# Patient Record
Sex: Female | Born: 1975
Health system: Southern US, Community
[De-identification: ages and names within clinical notes are randomized; demographics above are authoritative.]

## PROBLEM LIST (undated history)

## (undated) DIAGNOSIS — M549 Dorsalgia, unspecified: Secondary | ICD-10-CM

## (undated) DIAGNOSIS — M6740 Ganglion, unspecified site: Secondary | ICD-10-CM

## (undated) DIAGNOSIS — F32A Depression, unspecified: Secondary | ICD-10-CM

## (undated) DIAGNOSIS — I341 Nonrheumatic mitral (valve) prolapse: Secondary | ICD-10-CM

## (undated) DIAGNOSIS — F329 Major depressive disorder, single episode, unspecified: Secondary | ICD-10-CM

## (undated) DIAGNOSIS — G43909 Migraine, unspecified, not intractable, without status migrainosus: Secondary | ICD-10-CM

## (undated) DIAGNOSIS — G8929 Other chronic pain: Secondary | ICD-10-CM

## (undated) DIAGNOSIS — I499 Cardiac arrhythmia, unspecified: Secondary | ICD-10-CM

## (undated) HISTORY — DX: Migraine, unspecified, not intractable, without status migrainosus: G43.909

## (undated) HISTORY — PX: WISDOM TOOTH EXTRACTION: SHX21

## (undated) HISTORY — DX: Dorsalgia, unspecified: M54.9

## (undated) HISTORY — DX: Other chronic pain: G89.29

## (undated) HISTORY — DX: Ganglion, unspecified site: M67.40

## (undated) HISTORY — DX: Major depressive disorder, single episode, unspecified: F32.9

## (undated) HISTORY — DX: Nonrheumatic mitral (valve) prolapse: I34.1

## (undated) HISTORY — DX: Depression, unspecified: F32.A

## (undated) HISTORY — DX: Cardiac arrhythmia, unspecified: I49.9

---

## 2000-09-18 ENCOUNTER — Encounter: Admission: RE | Admit: 2000-09-18 | Discharge: 2000-10-18 | Payer: Self-pay | Admitting: Family Medicine

## 2009-12-02 ENCOUNTER — Inpatient Hospital Stay (HOSPITAL_COMMUNITY)
Admission: AD | Admit: 2009-12-02 | Discharge: 2009-12-02 | Payer: Self-pay | Source: Home / Self Care | Admitting: Obstetrics and Gynecology

## 2009-12-02 DIAGNOSIS — O209 Hemorrhage in early pregnancy, unspecified: Secondary | ICD-10-CM

## 2010-02-03 ENCOUNTER — Inpatient Hospital Stay (HOSPITAL_COMMUNITY): Admission: AD | Admit: 2010-02-03 | Payer: Self-pay | Admitting: Obstetrics and Gynecology

## 2010-04-01 ENCOUNTER — Inpatient Hospital Stay (HOSPITAL_COMMUNITY)
Admission: AD | Admit: 2010-04-01 | Discharge: 2010-04-01 | Disposition: A | Discharge status: Home / Self Care | Payer: BC Managed Care – PPO | Source: Ambulatory Visit | Attending: Obstetrics and Gynecology | Admitting: Obstetrics and Gynecology

## 2010-04-01 DIAGNOSIS — IMO0002 Reserved for concepts with insufficient information to code with codable children: Secondary | ICD-10-CM | POA: Insufficient documentation

## 2010-04-01 LAB — CBC
HCT: 35.9 % — ABNORMAL LOW (ref 36.0–46.0)
Hemoglobin: 11.8 g/dL — ABNORMAL LOW (ref 12.0–15.0)
MCH: 29.4 pg (ref 26.0–34.0)
MCHC: 32.9 g/dL (ref 30.0–36.0)
MCV: 89.3 fL (ref 78.0–100.0)
Platelets: 254 10*3/uL (ref 150–400)
RBC: 4.02 MIL/uL (ref 3.87–5.11)
RDW: 13.5 % (ref 11.5–15.5)
WBC: 14.1 10*3/uL — ABNORMAL HIGH (ref 4.0–10.5)

## 2010-04-01 LAB — COMPREHENSIVE METABOLIC PANEL
ALT: 13 U/L (ref 0–35)
AST: 17 U/L (ref 0–37)
Albumin: 2.7 g/dL — ABNORMAL LOW (ref 3.5–5.2)
Alkaline Phosphatase: 91 U/L (ref 39–117)
BUN: 3 mg/dL — ABNORMAL LOW (ref 6–23)
CO2: 26 mEq/L (ref 19–32)
Calcium: 9.5 mg/dL (ref 8.4–10.5)
Chloride: 104 mEq/L (ref 96–112)
Creatinine, Ser: 0.62 mg/dL (ref 0.4–1.2)
GFR calc Af Amer: >60 mL/min (ref >60)
GFR calc non Af Amer: >60 mL/min (ref >60)
Glucose, Bld: 101 mg/dL — ABNORMAL HIGH (ref 70–99)
Potassium: 3.6 mEq/L (ref 3.5–5.1)
Total Bilirubin: 0.2 mg/dL — ABNORMAL LOW (ref 0.3–1.2)

## 2010-04-01 LAB — URINALYSIS, ROUTINE W REFLEX MICROSCOPIC
Bilirubin Urine: NEGATIVE
Hgb urine dipstick: NEGATIVE
Ketones, ur: NEGATIVE mg/dL
Nitrite: NEGATIVE
Protein, ur: NEGATIVE mg/dL
Urine Glucose, Fasting: NEGATIVE mg/dL
Urobilinogen, UA: 0.2 mg/dL (ref 0.0–1.0)
pH: 7.5 (ref 5.0–8.0)

## 2010-04-01 LAB — LACTATE DEHYDROGENASE: LDH: 105 U/L (ref 94–250)

## 2010-04-01 LAB — URIC ACID: Uric Acid, Serum: 5.4 mg/dL (ref 2.4–7.0)

## 2010-06-08 ENCOUNTER — Inpatient Hospital Stay (HOSPITAL_COMMUNITY)
Admission: AD | Admit: 2010-06-08 | Discharge: 2010-06-10 | DRG: 372 | Disposition: A | Discharge status: Home / Self Care | Payer: BC Managed Care – PPO | Source: Ambulatory Visit | Attending: Obstetrics and Gynecology | Admitting: Obstetrics and Gynecology

## 2010-06-08 DIAGNOSIS — O329XX0 Maternal care for malpresentation of fetus, unspecified, not applicable or unspecified: Secondary | ICD-10-CM | POA: Diagnosis present

## 2010-06-08 DIAGNOSIS — O139 Gestational [pregnancy-induced] hypertension without significant proteinuria, unspecified trimester: Principal | ICD-10-CM | POA: Diagnosis present

## 2010-06-08 DIAGNOSIS — O328XX0 Maternal care for other malpresentation of fetus, not applicable or unspecified: Secondary | ICD-10-CM | POA: Diagnosis present

## 2010-06-08 LAB — COMPREHENSIVE METABOLIC PANEL
ALT: 15 U/L (ref 0–35)
AST: 20 U/L (ref 0–37)
Albumin: 2.7 g/dL — ABNORMAL LOW (ref 3.5–5.2)
Alkaline Phosphatase: 176 U/L — ABNORMAL HIGH (ref 39–117)
CO2: 20 mEq/L (ref 19–32)
Calcium: 10.5 mg/dL (ref 8.4–10.5)
Chloride: 103 mEq/L (ref 96–112)
Creatinine, Ser: 0.69 mg/dL (ref 0.4–1.2)
GFR calc Af Amer: >60 mL/min (ref >60)
Glucose, Bld: 104 mg/dL — ABNORMAL HIGH (ref 70–99)
Potassium: 4.1 mEq/L (ref 3.5–5.1)
Sodium: 138 mEq/L (ref 135–145)
Total Bilirubin: 0.2 mg/dL — ABNORMAL LOW (ref 0.3–1.2)
Total Protein: 6.3 g/dL (ref 6.0–8.3)

## 2010-06-08 LAB — LACTATE DEHYDROGENASE: LDH: 171 U/L (ref 94–250)

## 2010-06-08 LAB — CBC
HCT: 38.9 % (ref 36.0–46.0)
MCH: 29.1 pg (ref 26.0–34.0)
MCHC: 33.2 g/dL (ref 30.0–36.0)
Platelets: 236 10*3/uL (ref 150–400)
RBC: 4.43 MIL/uL (ref 3.87–5.11)
RDW: 14.3 % (ref 11.5–15.5)
WBC: 11.3 10*3/uL — ABNORMAL HIGH (ref 4.0–10.5)

## 2010-06-08 LAB — URIC ACID: Uric Acid, Serum: 7.2 mg/dL — ABNORMAL HIGH (ref 2.4–7.0)

## 2010-06-09 LAB — CBC
HCT: 34.8 % — ABNORMAL LOW (ref 36.0–46.0)
Hemoglobin: 11.2 g/dL — ABNORMAL LOW (ref 12.0–15.0)
MCH: 28.4 pg (ref 26.0–34.0)
MCHC: 32.2 g/dL (ref 30.0–36.0)
MCV: 88.3 fL (ref 78.0–100.0)
Platelets: 206 10*3/uL (ref 150–400)
RBC: 3.94 MIL/uL (ref 3.87–5.11)
WBC: 17.7 10*3/uL — ABNORMAL HIGH (ref 4.0–10.5)

## 2013-04-09 ENCOUNTER — Encounter: Payer: Self-pay | Admitting: *Deleted

## 2013-04-09 DIAGNOSIS — E669 Obesity, unspecified: Secondary | ICD-10-CM | POA: Insufficient documentation

## 2013-04-09 DIAGNOSIS — M6740 Ganglion, unspecified site: Secondary | ICD-10-CM | POA: Insufficient documentation

## 2013-04-09 DIAGNOSIS — E789 Disorder of lipoprotein metabolism, unspecified: Secondary | ICD-10-CM | POA: Insufficient documentation

## 2013-04-12 ENCOUNTER — Other Ambulatory Visit: Payer: Self-pay | Admitting: *Deleted

## 2013-04-12 DIAGNOSIS — Z0000 Encounter for general adult medical examination without abnormal findings: Secondary | ICD-10-CM

## 2013-04-13 ENCOUNTER — Other Ambulatory Visit: Payer: BC Managed Care – PPO

## 2013-04-14 ENCOUNTER — Other Ambulatory Visit: Payer: BC Managed Care – PPO

## 2013-04-14 LAB — COMPLETE METABOLIC PANEL WITH GFR
ALT: 25 U/L (ref 0–35)
AST: 24 U/L (ref 0–37)
Albumin: 4.1 g/dL (ref 3.5–5.2)
Alkaline Phosphatase: 86 U/L (ref 39–117)
BUN: 7 mg/dL (ref 6–23)
CO2: 25 mEq/L (ref 19–32)
Calcium: 9.2 mg/dL (ref 8.4–10.5)
Chloride: 104 mEq/L (ref 96–112)
Creat: 0.6 mg/dL (ref 0.50–1.10)
GFR, Est African American: >89 mL/min
GFR, Est Non African American: >89 mL/min
Glucose, Bld: 102 mg/dL — ABNORMAL HIGH (ref 70–99)
Potassium: 3.8 mEq/L (ref 3.5–5.3)
Sodium: 138 mEq/L (ref 135–145)
Total Bilirubin: 0.3 mg/dL (ref 0.2–1.2)
Total Protein: 6.6 g/dL (ref 6.0–8.3)

## 2013-04-14 LAB — CBC WITH DIFFERENTIAL/PLATELET
Basophils Absolute: 0 10*3/uL (ref 0.0–0.1)
Basophils Relative: 0 % (ref 0–1)
Eosinophils Absolute: 0.5 10*3/uL (ref 0.0–0.7)
Eosinophils Relative: 6 % — ABNORMAL HIGH (ref 0–5)
HCT: 40.6 % (ref 36.0–46.0)
Hemoglobin: 13.9 g/dL (ref 12.0–15.0)
Lymphocytes Relative: 26 % (ref 12–46)
Lymphs Abs: 2 10*3/uL (ref 0.7–4.0)
MCH: 29.7 pg (ref 26.0–34.0)
MCHC: 34.2 g/dL (ref 30.0–36.0)
MCV: 86.8 fL (ref 78.0–100.0)
Monocytes Absolute: 0.5 10*3/uL (ref 0.1–1.0)
Monocytes Relative: 7 % (ref 3–12)
Neutro Abs: 4.7 10*3/uL (ref 1.7–7.7)
Neutrophils Relative %: 61 % (ref 43–77)
Platelets: 356 10*3/uL (ref 150–400)
RBC: 4.68 MIL/uL (ref 3.87–5.11)
RDW: 14.1 % (ref 11.5–15.5)
WBC: 7.7 10*3/uL (ref 4.0–10.5)

## 2013-04-14 LAB — LIPID PANEL
Cholesterol: 142 mg/dL (ref 0–200)
HDL: 38 mg/dL — ABNORMAL LOW (ref >39)
LDL Cholesterol: 79 mg/dL (ref 0–99)
Total CHOL/HDL Ratio: 3.7 Ratio
Triglycerides: 125 mg/dL (ref <150)
VLDL: 25 mg/dL (ref 0–40)

## 2013-04-15 LAB — TSH: TSH: 2.447 u[IU]/mL (ref 0.350–4.500)

## 2013-04-20 ENCOUNTER — Ambulatory Visit (INDEPENDENT_AMBULATORY_CARE_PROVIDER_SITE_OTHER): Payer: BC Managed Care – PPO | Admitting: Family Medicine

## 2013-04-20 ENCOUNTER — Encounter (INDEPENDENT_AMBULATORY_CARE_PROVIDER_SITE_OTHER): Payer: Self-pay

## 2013-04-20 ENCOUNTER — Encounter: Payer: Self-pay | Admitting: Family Medicine

## 2013-04-20 VITALS — BP 123/84 | HR 72 | Resp 16 | Ht 62.0 in | Wt 199.0 lb

## 2013-04-20 DIAGNOSIS — M549 Dorsalgia, unspecified: Secondary | ICD-10-CM

## 2013-04-20 DIAGNOSIS — E669 Obesity, unspecified: Secondary | ICD-10-CM

## 2013-04-20 DIAGNOSIS — G5600 Carpal tunnel syndrome, unspecified upper limb: Secondary | ICD-10-CM

## 2013-04-20 DIAGNOSIS — Z5181 Encounter for therapeutic drug level monitoring: Secondary | ICD-10-CM

## 2013-04-20 DIAGNOSIS — R7301 Impaired fasting glucose: Secondary | ICD-10-CM

## 2013-04-20 MED ORDER — DICLOFENAC SODIUM 75 MG PO TBEC: 75.0000 mg | DELAYED_RELEASE_TABLET | Freq: Two times a day (BID) | ORAL | Status: DC
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
## 2013-04-20 NOTE — Patient Instructions (Signed)
1)  Joint Pain - High quality fish oil Lisette Grinder([employer]) - 2000 - 4000 mg EPA/DHA.  Back - Back Magic; Wrists - Wrist split made by [employer].  Diclofenac 75 mg twice a day +/- Flexeril.  You might also consider Cymbalta.         Carpal Tunnel Syndrome The carpal tunnel is a narrow area located on the palm side of your wrist. The tunnel is formed by the wrist bones and ligaments. Nerves, Rorrer vessels, and tendons pass through the carpal tunnel. Repeated wrist motion or certain diseases may cause swelling within the tunnel. This swelling pinches the main nerve in the wrist (median nerve) and causes the painful hand and arm condition called carpal tunnel syndrome. CAUSES   Repeated wrist motions.  Wrist injuries.  Certain diseases like arthritis, diabetes, alcoholism, hyperthyroidism, and kidney failure.  Obesity.  Pregnancy. SYMPTOMS   A "pins and needles" feeling in your fingers or hand.  Tingling or numbness in your fingers or hand.  An aching feeling in your entire arm.  Wrist pain that goes up your arm to your shoulder.  Pain that goes down into your palm or fingers.  A weak feeling in your hands. DIAGNOSIS  Your caregiver will take your history and perform a physical exam. An electromyography test may be needed. This test measures electrical signals sent out by the muscles. The electrical signals are usually slowed by carpal tunnel syndrome. You may also need X-rays. TREATMENT  Carpal tunnel syndrome may clear up by itself. Your caregiver may recommend a wrist splint or medicine such as a nonsteroidal anti-inflammatory medicine. Cortisone injections may help. Sometimes, surgery may be needed to free the pinched nerve.  HOME CARE INSTRUCTIONS   Take all medicine as directed by your caregiver. Only take over-the-counter or prescription medicines for pain, discomfort, or fever as directed by your caregiver.  If you were given a splint to keep your wrist from  bending, wear it as directed. It is important to wear the splint at night. Wear the splint for as long as you have pain or numbness in your hand, arm, or wrist. This may take 1 to 2 months.  Rest your wrist from any activity that may be causing your pain. If your symptoms are work-related, you may need to talk to your employer about changing to a job that does not require using your wrist.  Put ice on your wrist after long periods of wrist activity.  Put ice in a plastic bag.  Place a towel between your skin and the bag.  Leave the ice on for 15-20 minutes, 03-04 times a day.  Keep all follow-up visits as directed by your caregiver. This includes any orthopedic referrals, physical therapy, and rehabilitation. Any delay in getting necessary care could result in a delay or failure of your condition to heal. SEEK IMMEDIATE MEDICAL CARE IF:   You have new, unexplained symptoms.  Your symptoms get worse and are not helped or controlled with medicines. MAKE SURE YOU:   Understand these instructions.  Will watch your condition.  Will get help right away if you are not doing well or get worse. Document Released: 01/19/2000 Document Revised: 04/15/2011 Document Reviewed: 12/07/2010 [facility] Patient Information 2014 [place], [place].

## 2013-05-14 ENCOUNTER — Encounter: Payer: Self-pay | Admitting: *Deleted

## 2013-06-20 DIAGNOSIS — R7301 Impaired fasting glucose: Secondary | ICD-10-CM | POA: Insufficient documentation

## 2013-06-20 DIAGNOSIS — M549 Dorsalgia, unspecified: Secondary | ICD-10-CM | POA: Insufficient documentation

## 2013-06-20 DIAGNOSIS — Z5181 Encounter for therapeutic drug level monitoring: Secondary | ICD-10-CM | POA: Insufficient documentation

## 2013-06-20 DIAGNOSIS — G5600 Carpal tunnel syndrome, unspecified upper limb: Secondary | ICD-10-CM | POA: Insufficient documentation

## 2013-06-20 DIAGNOSIS — E669 Obesity, unspecified: Secondary | ICD-10-CM | POA: Insufficient documentation

## 2013-07-22 ENCOUNTER — Ambulatory Visit (INDEPENDENT_AMBULATORY_CARE_PROVIDER_SITE_OTHER): Payer: BC Managed Care – PPO | Admitting: Family Medicine

## 2013-07-22 ENCOUNTER — Encounter: Payer: Self-pay | Admitting: Family Medicine

## 2013-07-22 VITALS — BP 136/66 | HR 90 | Resp 16 | Ht 62.5 in | Wt 206.0 lb

## 2013-07-22 DIAGNOSIS — M549 Dorsalgia, unspecified: Secondary | ICD-10-CM

## 2013-07-22 DIAGNOSIS — G8929 Other chronic pain: Secondary | ICD-10-CM

## 2013-07-22 MED ORDER — METAXALONE 800 MG PO TABS: 800.0000 mg | ORAL_TABLET | Freq: Four times a day (QID) | ORAL | Status: AC

## 2013-07-22 MED ORDER — NAPROXEN 500 MG PO TABS: 500.0000 mg | ORAL_TABLET | Freq: Two times a day (BID) | ORAL | Status: AC

## 2013-07-22 MED ORDER — TRAMADOL-ACETAMINOPHEN 37.5-325 MG PO TABS: 1.0000 | ORAL_TABLET | Freq: Four times a day (QID) | ORAL | Status: AC | PRN
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
## 2013-08-03 ENCOUNTER — Ambulatory Visit (HOSPITAL_BASED_OUTPATIENT_CLINIC_OR_DEPARTMENT_OTHER): Payer: BC Managed Care – PPO

## 2013-09-04 ENCOUNTER — Ambulatory Visit (HOSPITAL_BASED_OUTPATIENT_CLINIC_OR_DEPARTMENT_OTHER): Payer: BC Managed Care – PPO

## 2013-09-24 ENCOUNTER — Encounter: Payer: Self-pay | Admitting: Family Medicine

## 2013-10-12 ENCOUNTER — Other Ambulatory Visit (HOSPITAL_BASED_OUTPATIENT_CLINIC_OR_DEPARTMENT_OTHER): Payer: BC Managed Care – PPO

## 2014-10-19 ENCOUNTER — Emergency Department (HOSPITAL_BASED_OUTPATIENT_CLINIC_OR_DEPARTMENT_OTHER)
Admission: EM | Admit: 2014-10-19 | Discharge: 2014-10-19 | Disposition: A | Discharge status: Home / Self Care | Payer: No Typology Code available for payment source | Attending: Emergency Medicine | Admitting: Emergency Medicine

## 2014-10-19 ENCOUNTER — Encounter (HOSPITAL_BASED_OUTPATIENT_CLINIC_OR_DEPARTMENT_OTHER): Payer: Self-pay

## 2014-10-19 ENCOUNTER — Emergency Department (HOSPITAL_BASED_OUTPATIENT_CLINIC_OR_DEPARTMENT_OTHER): Payer: No Typology Code available for payment source

## 2014-10-19 DIAGNOSIS — Y998 Other external cause status: Secondary | ICD-10-CM | POA: Insufficient documentation

## 2014-10-19 DIAGNOSIS — S20219A Contusion of unspecified front wall of thorax, initial encounter: Secondary | ICD-10-CM | POA: Insufficient documentation

## 2014-10-19 DIAGNOSIS — Z8739 Personal history of other diseases of the musculoskeletal system and connective tissue: Secondary | ICD-10-CM | POA: Insufficient documentation

## 2014-10-19 DIAGNOSIS — Z8679 Personal history of other diseases of the circulatory system: Secondary | ICD-10-CM | POA: Diagnosis not present

## 2014-10-19 DIAGNOSIS — Y92410 Unspecified street and highway as the place of occurrence of the external cause: Secondary | ICD-10-CM | POA: Insufficient documentation

## 2014-10-19 DIAGNOSIS — G8929 Other chronic pain: Secondary | ICD-10-CM | POA: Insufficient documentation

## 2014-10-19 DIAGNOSIS — S29001A Unspecified injury of muscle and tendon of front wall of thorax, initial encounter: Secondary | ICD-10-CM | POA: Diagnosis present

## 2014-10-19 DIAGNOSIS — Y9389 Activity, other specified: Secondary | ICD-10-CM | POA: Insufficient documentation

## 2014-10-19 DIAGNOSIS — F329 Major depressive disorder, single episode, unspecified: Secondary | ICD-10-CM | POA: Insufficient documentation

## 2014-10-19 MED ORDER — ORPHENADRINE CITRATE ER 100 MG PO TB12: 100.0000 mg | ORAL_TABLET | Freq: Two times a day (BID) | ORAL | Status: DC

## 2014-10-19 MED ORDER — TRAMADOL HCL 50 MG PO TABS: 50.0000 mg | ORAL_TABLET | Freq: Four times a day (QID) | ORAL | Status: DC | PRN

## 2014-10-19 MED ORDER — IBUPROFEN 800 MG PO TABS
800.0000 mg | ORAL_TABLET | Freq: Once | ORAL | Status: AC
  Administered 2014-10-19: 800 mg via ORAL
  Filled 2014-10-19: qty 1

## 2014-10-19 MED ORDER — IBUPROFEN 600 MG PO TABS: 600.0000 mg | ORAL_TABLET | Freq: Four times a day (QID) | ORAL | Status: DC | PRN

## 2014-10-19 NOTE — ED Notes (Signed)
Patient transported to X-ray 
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
## 2014-10-19 NOTE — Discharge Instructions (Signed)
Chest Contusion °A chest contusion is a deep bruise on your chest area. Contusions are the result of an injury that caused bleeding under the skin. A chest contusion may involve bruising of the skin, muscles, or ribs. The contusion may turn blue, purple, or yellow. Minor injuries will give you a painless contusion, but more severe contusions may stay painful and swollen for a few weeks. °CAUSES  °A contusion is usually caused by a blow, trauma, or direct force to an area of the body. °SYMPTOMS  °· Swelling and redness of the injured area. °· Discoloration of the injured area. °· Tenderness and soreness of the injured area. °· Pain. °DIAGNOSIS  °The diagnosis can be made by taking a history and performing a physical exam. An X-ray, CT scan, or MRI may be needed to determine if there were any associated injuries, such as broken bones (fractures) or internal injuries. °TREATMENT  °Often, the best treatment for a chest contusion is resting, icing, and applying cold compresses to the injured area. Deep breathing exercises may be recommended to reduce the risk of pneumonia. Over-the-counter medicines may also be recommended for pain control. °HOME CARE INSTRUCTIONS  °· Put ice on the injured area. °· Put ice in a plastic bag. °· Place a towel between your skin and the bag. °· Leave the ice on for 15-20 minutes, 03-04 times a day. °· Only take over-the-counter or prescription medicines as directed by your caregiver. Your caregiver may recommend avoiding anti-inflammatory medicines (aspirin, ibuprofen, and naproxen) for 48 hours because these medicines may increase bruising. °· Rest the injured area. °· Perform deep-breathing exercises as directed by your caregiver. °· Stop smoking if you smoke. °· Do not lift objects over 5 pounds (2.3 kg) for 3 days or longer if recommended by your caregiver. °SEEK IMMEDIATE MEDICAL CARE IF:  °· You have increased bruising or swelling. °· You have pain that is getting worse. °· You have  difficulty breathing. °· You have dizziness, weakness, or fainting. °· You have [place] in your urine or stool. °· You cough up or vomit [place]amp[place]. °· Your swelling or pain is not relieved with medicines. °MAKE SURE YOU:  °· Understand these instructions. °· Will watch your condition. °· Will get help right away if you are not doing well or get worse. °Document Released: 10/16/2000 Document Revised: 10/16/2011 Document Reviewed: 07/15/2011 °ExitCare® Patient Information ©2015 ExitCare, LLC. This information is not intended to replace advice given to you by your health care provider. Make sure you discuss any questions you have with your health care provider. ° °Motor Vehicle Collision °It is common to have multiple bruises and sore muscles after a motor vehicle collision (MVC). These tend to feel worse for the first 24 hours. You may have the most stiffness and soreness over the first several hours. You may also feel worse when you wake up the first morning after your collision. After this point, you will usually begin to improve with each day. The speed of improvement often depends on the severity of the collision, the number of injuries, and the location and nature of these injuries. °HOME CARE INSTRUCTIONS °· Put ice on the injured area. °¨ Put ice in a plastic bag. °¨ Place a towel between your skin and the bag. °¨ Leave the ice on for 15-20 minutes, 3-4 times a day, or as directed by your health care provider. °· Drink enough fluids to keep your urine clear or pale yellow. Do not drink alcohol. °· Take a   warm shower or bath once or twice a day. This will increase [place] flow to sore muscles. °· You may return to activities as directed by your caregiver. Be careful when lifting, as this may aggravate neck or back pain. °· Only take over-the-counter or prescription medicines for pain, discomfort, or fever as directed by your caregiver. Do not use aspirin. This may increase bruising and bleeding. °SEEK IMMEDIATE MEDICAL  CARE IF: °· You have numbness, tingling, or weakness in the arms or legs. °· You develop severe headaches not relieved with medicine. °· You have severe neck pain, especially tenderness in the middle of the back of your neck. °· You have changes in bowel or bladder control. °· There is increasing pain in any area of the body. °· You have shortness of breath, light-headedness, dizziness, or fainting. °· You have chest pain. °· You feel sick to your stomach (nauseous), throw up (vomit), or sweat. °· You have increasing abdominal discomfort. °· There is [place] in your urine, stool, or vomit. °· You have pain in your shoulder (shoulder strap areas). °· You feel your symptoms are getting worse. °MAKE SURE YOU: °· Understand these instructions. °· Will watch your condition. °· Will get help right away if you are not doing well or get worse. °Document Released: 01/21/2005 Document Revised: 06/07/2013 Document Reviewed: 06/20/2010 °ExitCare® Patient Information ©2015 ExitCare, LLC. This information is not intended to replace advice given to you by your health care provider. Make sure you discuss any questions you have with your health care provider. ° °

## 2015-10-11 DIAGNOSIS — Z01419 Encounter for gynecological examination (general) (routine) without abnormal findings: Secondary | ICD-10-CM | POA: Diagnosis not present

## 2015-10-11 DIAGNOSIS — N898 Other specified noninflammatory disorders of vagina: Secondary | ICD-10-CM | POA: Diagnosis not present

## 2015-10-11 DIAGNOSIS — Z6838 Body mass index (BMI) 38.0-38.9, adult: Secondary | ICD-10-CM | POA: Diagnosis not present

## 2015-10-11 DIAGNOSIS — Z1151 Encounter for screening for human papillomavirus (HPV): Secondary | ICD-10-CM | POA: Diagnosis not present

## 2016-04-02 DIAGNOSIS — Z23 Encounter for immunization: Secondary | ICD-10-CM | POA: Diagnosis not present

## 2016-12-05 DIAGNOSIS — J020 Streptococcal pharyngitis: Secondary | ICD-10-CM | POA: Diagnosis not present

## 2016-12-05 DIAGNOSIS — J029 Acute pharyngitis, unspecified: Secondary | ICD-10-CM | POA: Diagnosis not present

## 2016-12-05 DIAGNOSIS — R05 Cough: Secondary | ICD-10-CM | POA: Diagnosis not present

## 2017-03-13 DIAGNOSIS — Z1231 Encounter for screening mammogram for malignant neoplasm of breast: Secondary | ICD-10-CM | POA: Diagnosis not present

## 2017-08-26 ENCOUNTER — Ambulatory Visit: Payer: BLUE CROSS/BLUE SHIELD | Admitting: Women's Health

## 2017-08-26 ENCOUNTER — Encounter: Payer: Self-pay | Admitting: Women's Health

## 2017-08-26 VITALS — BP 122/80 | Ht 63.0 in | Wt 220.0 lb

## 2017-08-26 DIAGNOSIS — Z13220 Encounter for screening for lipoid disorders: Secondary | ICD-10-CM

## 2017-08-26 DIAGNOSIS — Z01419 Encounter for gynecological examination (general) (routine) without abnormal findings: Secondary | ICD-10-CM | POA: Diagnosis not present

## 2017-08-26 DIAGNOSIS — N898 Other specified noninflammatory disorders of vagina: Secondary | ICD-10-CM

## 2017-08-26 LAB — WET PREP FOR TRICH, YEAST, CLUE

## 2017-08-26 MED ORDER — TERCONAZOLE 0.4 % VA CREA: 1.0000 | TOPICAL_CREAM | Freq: Every day | VAGINAL | 0 refills | Status: DC

## 2017-08-26 NOTE — Progress Notes (Signed)
Norwood LevoCarol L Tschetter 12/07/1975 [id number]    History:    Presents for annual exam.  New patient.  Regular monthly cycle/vasectomy.  Normal Pap and mammogram history.  Mammogram 03/2017 at [facility].  Past medical history, past surgical history, family history and social history were all reviewed and documented in the EPIC chart.  Works for [employer].  3 children ages 5817, 6912 and 7 all doing well.  ROS:  A ROS was performed and pertinent positives and negatives are included.  Exam:  Vitals:   08/26/17 1038  BP: 122/80  Weight: 220 lb (99.8 kg)  Height: 5\' 3"  (1.6 m)   Body mass index is 38.97 kg/m.   General appearance:  Normal Thyroid:  Symmetrical, normal in size, without palpable masses or nodularity. Respiratory  Auscultation:  Clear without wheezing or rhonchi Cardiovascular  Auscultation:  Regular rate, without rubs, murmurs or gallops  Edema/varicosities:  Not grossly evident Abdominal  Soft,nontender, without masses, guarding or rebound.  Liver/spleen:  No organomegaly noted  Hernia:  None appreciated  Skin  Inspection:  Grossly normal   Breasts: Examined lying and sitting.     Right: Without masses, retractions, discharge or axillary adenopathy.     Left: Without masses, retractions, discharge or axillary adenopathy. Gentitourinary   Inguinal/mons:  Normal without inguinal adenopathy  External genitalia:  Normal  BUS/Urethra/Skene's glands:  Normal  Vagina:  Normal wet prep negative  Cervix:  Normal  Uterus:   normal in size, shape and contour.  Midline and mobile  Adnexa/parametria:     Rt: Without masses or tenderness.   Lt: Without masses or tenderness.  Anus and perineum: Normal  Digital rectal exam: Normal sphincter tone without palpated masses or tenderness  Assessment/Plan:  42 y.o. MWF G3 3P3 for annual exam with complaint of external vaginal itching.  Regular monthly cycle/vasectomy Vaginal itching Obesity  Plan: Reviewed normality of wet prep  and exam, will try Terazol 7 apply small amount externally at bedtime as needed, yeast prevention discussed.  Instructed to call if continued problems.  Will return to office fasting for CBC, CMP, lipid panel and hemoglobin A1c.  History of elevated A1c in the past, has recently lost 15 pounds with diet.  Reviewed importance of increasing regular cardio type exercise, walking most days, decrease calorie/carbs.  SBE's, continue annual screening mammogram, instructed to have results faxed to our office.  Pap normal with negative HR HPV 2017, new screening guidelines reviewed.Harrington Challenger.    Nancy J Young [facility] HospitalWHNP, 11:15 AM 08/26/2017

## 2017-08-26 NOTE — Patient Instructions (Addendum)
Health Maintenance, Female Adopting a healthy lifestyle and getting preventive care can go a long way to promote health and wellness. Talk with your health care provider about what schedule of regular examinations is right for you. This is a good chance for you to check in with your provider about disease prevention and staying healthy. In between checkups, there are plenty of things you can do on your own. Experts have done a lot of research about which lifestyle changes and preventive measures are most likely to keep you healthy. Ask your health care provider for more information. Weight and diet Eat a healthy diet  Be sure to include plenty of vegetables, fruits, low-fat dairy products, and lean protein.  Do not eat a lot of foods high in solid fats, added sugars, or salt.  Get regular exercise. This is one of the most important things you can do for your health. ? Most adults should exercise for at least 150 minutes each week. The exercise should increase your heart rate and make you sweat (moderate-intensity exercise). ? Most adults should also do strengthening exercises at least twice a week. This is in addition to the moderate-intensity exercise.  Maintain a healthy weight  Body mass index (BMI) is a measurement that can be used to identify possible weight problems. It estimates body fat based on height and weight. Your health care provider can help determine your BMI and help you achieve or maintain a healthy weight.  For females 20 years of age and older: ? A BMI below 18.5 is considered underweight. ? A BMI of 18.5 to 24.9 is normal. ? A BMI of 25 to 29.9 is considered overweight. ? A BMI of 30 and above is considered obese.  Watch levels of cholesterol and Ballweg lipids  You should start having your Pulcini tested for lipids and cholesterol at 41 years of age, then have this test every 5 years.  You may need to have your cholesterol levels checked more often if: ? Your lipid or  cholesterol levels are high. ? You are older than 41 years of age. ? You are at high risk for heart disease.  Cancer screening Lung Cancer  Lung cancer screening is recommended for adults 55-80 years old who are at high risk for lung cancer because of a history of smoking.  A yearly low-dose CT scan of the lungs is recommended for people who: ? Currently smoke. ? Have quit within the past 15 years. ? Have at least a 30-pack-year history of smoking. A pack year is smoking an average of one pack of cigarettes a day for 1 year.  Yearly screening should continue until it has been 15 years since you quit.  Yearly screening should stop if you develop a health problem that would prevent you from having lung cancer treatment.  Breast Cancer  Practice breast self-awareness. This means understanding how your breasts normally appear and feel.  It also means doing regular breast self-exams. Let your health care provider know about any changes, no matter how small.  If you are in your 20s or 30s, you should have a clinical breast exam (CBE) by a health care provider every 1-3 years as part of a regular health exam.  If you are 40 or older, have a CBE every year. Also consider having a breast X-ray (mammogram) every year.  If you have a family history of breast cancer, talk to your health care provider about genetic screening.  If you are at high risk   for breast cancer, talk to your health care provider about having an MRI and a mammogram every year.  Breast cancer gene (BRCA) assessment is recommended for women who have family members with BRCA-related cancers. BRCA-related cancers include: ? Breast. ? Ovarian. ? Tubal. ? Peritoneal cancers.  Results of the assessment will determine the need for genetic counseling and BRCA1 and BRCA2 testing.  Cervical Cancer Your health care provider may recommend that you be screened regularly for cancer of the pelvic organs (ovaries, uterus, and  vagina). This screening involves a pelvic examination, including checking for microscopic changes to the surface of your cervix (Pap test). You may be encouraged to have this screening done every 3 years, beginning at age 22.  For women ages 56-65, health care providers may recommend pelvic exams and Pap testing every 3 years, or they may recommend the Pap and pelvic exam, combined with testing for human papilloma virus (HPV), every 5 years. Some types of HPV increase your risk of cervical cancer. Testing for HPV may also be done on women of any age with unclear Pap test results.  Other health care providers may not recommend any screening for nonpregnant women who are considered low risk for pelvic cancer and who do not have symptoms. Ask your health care provider if a screening pelvic exam is right for you.  If you have had past treatment for cervical cancer or a condition that could lead to cancer, you need Pap tests and screening for cancer for at least 20 years after your treatment. If Pap tests have been discontinued, your risk factors (such as having a new sexual partner) need to be reassessed to determine if screening should resume. Some women have medical problems that increase the chance of getting cervical cancer. In these cases, your health care provider may recommend more frequent screening and Pap tests.  Colorectal Cancer  This type of cancer can be detected and often prevented.  Routine colorectal cancer screening usually begins at 41 years of age and continues through 41 years of age.  Your health care provider may recommend screening at an earlier age if you have risk factors for colon cancer.  Your health care provider may also recommend using home test kits to check for hidden Arp in the stool.  A small camera at the end of a tube can be used to examine your colon directly (sigmoidoscopy or colonoscopy). This is done to check for the earliest forms of colorectal  cancer.  Routine screening usually begins at age 33.  Direct examination of the colon should be repeated every 5-10 years through 41 years of age. However, you may need to be screened more often if early forms of precancerous polyps or small growths are found.  Skin Cancer  Check your skin from head to toe regularly.  Tell your health care provider about any new moles or changes in moles, especially if there is a change in a mole's shape or color.  Also tell your health care provider if you have a mole that is larger than the size of a pencil eraser.  Always use sunscreen. Apply sunscreen liberally and repeatedly throughout the day.  Protect yourself by wearing long sleeves, pants, a wide-brimmed hat, and sunglasses whenever you are outside.  Heart disease, diabetes, and high Hiemstra pressure  High Tarango pressure causes heart disease and increases the risk of stroke. High Lem pressure is more likely to develop in: ? People who have Causey pressure in the high end of  the normal range (130-139/85-89 mm Hg). ? People who are overweight or obese. ? People who are African American.  If you are 21-29 years of age, have your Imhoff pressure checked every 3-5 years. If you are 3 years of age or older, have your Weideman pressure checked every year. You should have your Dabney pressure measured twice-once when you are at a hospital or clinic, and once when you are not at a hospital or clinic. Record the average of the two measurements. To check your Housholder pressure when you are not at a hospital or clinic, you can use: ? An automated Delaney pressure machine at a pharmacy. ? A home Porrata pressure monitor.  If you are between 17 years and 37 years old, ask your health care provider if you should take aspirin to prevent strokes.  Have regular diabetes screenings. This involves taking a Galyean sample to check your fasting Murrell sugar level. ? If you are at a normal weight and have a low risk for diabetes,  have this test once every three years after 41 years of age. ? If you are overweight and have a high risk for diabetes, consider being tested at a younger age or more often. Preventing infection Hepatitis B  If you have a higher risk for hepatitis B, you should be screened for this virus. You are considered at high risk for hepatitis B if: ? You were born in a country where hepatitis B is common. Ask your health care provider which countries are considered high risk. ? Your parents were born in a high-risk country, and you have not been immunized against hepatitis B (hepatitis B vaccine). ? You have HIV or AIDS. ? You use needles to inject street drugs. ? You live with someone who has hepatitis B. ? You have had sex with someone who has hepatitis B. ? You get hemodialysis treatment. ? You take certain medicines for conditions, including cancer, organ transplantation, and autoimmune conditions.  Hepatitis C  Matusek testing is recommended for: ? Everyone born from [birth year] through 1964. ? Anyone with known risk factors for hepatitis C.  Sexually transmitted infections (STIs)  You should be screened for sexually transmitted infections (STIs) including gonorrhea and chlamydia if: ? You are sexually active and are younger than 41 years of age. ? You are older than 41 years of age and your health care provider tells you that you are at risk for this type of infection. ? Your sexual activity has changed since you were last screened and you are at an increased risk for chlamydia or gonorrhea. Ask your health care provider if you are at risk.  If you do not have HIV, but are at risk, it may be recommended that you take a prescription medicine daily to prevent HIV infection. This is called pre-exposure prophylaxis (PrEP). You are considered at risk if: ? You are sexually active and do not regularly use condoms or know the HIV status of your partner(s). ? You take drugs by injection. ? You are  sexually active with a partner who has HIV.  Talk with your health care provider about whether you are at high risk of being infected with HIV. If you choose to begin PrEP, you should first be tested for HIV. You should then be tested every 3 months for as long as you are taking PrEP. Pregnancy  If you are premenopausal and you may become pregnant, ask your health care provider about preconception counseling.  If you may become  pregnant, take 400 to 800 micrograms (mcg) of folic acid every day.  If you want to prevent pregnancy, talk to your health care provider about birth control (contraception). Osteoporosis and menopause  Osteoporosis is a disease in which the bones lose minerals and strength with aging. This can result in serious bone fractures. Your risk for osteoporosis can be identified using a bone density scan.  If you are 65 years of age or older, or if you are at risk for osteoporosis and fractures, ask your health care provider if you should be screened.  Ask your health care provider whether you should take a calcium or vitamin D supplement to lower your risk for osteoporosis.  Menopause may have certain physical symptoms and risks.  Hormone replacement therapy may reduce some of these symptoms and risks. Talk to your health care provider about whether hormone replacement therapy is right for you. Follow these instructions at home:  Schedule regular health, dental, and eye exams.  Stay current with your immunizations.  Do not use any tobacco products including cigarettes, chewing tobacco, or electronic cigarettes.  If you are pregnant, do not drink alcohol.  If you are breastfeeding, limit how much and how often you drink alcohol.  Limit alcohol intake to no more than 1 drink per day for nonpregnant women. One drink equals 12 ounces of beer, 5 ounces of wine, or 1 ounces of hard liquor.  Do not use street drugs.  Do not share needles.  Ask your health care  provider for help if you need support or information about quitting drugs.  Tell your health care provider if you often feel depressed.  Tell your health care provider if you have ever been abused or do not feel safe at home. This information is not intended to replace advice given to you by your health care provider. Make sure you discuss any questions you have with your health care provider. Document Released: 08/06/2010 Document Revised: 06/29/2015 Document Reviewed: 10/25/2014 Elsevier Interactive Patient Education  2018 Else[employer].  Carbohydrate Counting for Diabetes Mellitus, Adult Carbohydrate counting is a method for keeping track of how many carbohydrates you eat. Eating carbohydrates naturally increases the amount of sugar (glucose) in the Haydel. Counting how many carbohydrates you eat helps keep your Wint glucose within normal limits, which helps you manage your diabetes (diabetes mellitus). It is important to know how many carbohydrates you can safely have in each meal. This is different for every person. A diet and nutrition specialist (registered dietitian) can help you make a meal plan and calculate how many carbohydrates you should have at each meal and snack. Carbohydrates are found in the following foods:  Grains, such as breads and cereals.  Dried beans and soy products.  Starchy vegetables, such as potatoes, peas, and corn.  Fruit and fruit juices.  Milk and yogurt.  Sweets and snack foods, such as cake, cookies, candy, chips, and soft drinks.  How do I count carbohydrates? There are two ways to count carbohydrates in food. You can use either of the methods or a combination of both. Reading "Nutrition Facts" on packaged food The "Nutrition Facts" list is included on the labels of almost all packaged foods and beverages in the [place] It includes:  The serving size.  Information about nutrients in each serving, including the grams (g) of carbohydrate per  serving.  To use the "Nutrition Facts":  Decide how many servings you will have.  Multiply the number of servings by the number of   carbohydrates per serving.  The resulting number is the total amount of carbohydrates that you will be having.  Learning standard serving sizes of other foods When you eat foods containing carbohydrates that are not packaged or do not include "Nutrition Facts" on the label, you need to measure the servings in order to count the amount of carbohydrates:  Measure the foods that you will eat with a food scale or measuring cup, if needed.  Decide how many standard-size servings you will eat.  Multiply the number of servings by 15. Most carbohydrate-rich foods have about 15 g of carbohydrates per serving. ? For example, if you eat 8 oz (170 g) of strawberries, you will have eaten 2 servings and 30 g of carbohydrates (2 servings x 15 g = 30 g).  For foods that have more than one food mixed, such as soups and casseroles, you must count the carbohydrates in each food that is included.  The following list contains standard serving sizes of common carbohydrate-rich foods. Each of these servings has about 15 g of carbohydrates:   hamburger bun or  English muffin.   oz (15 mL) syrup.   oz (14 g) jelly.  1 slice of bread.  1 six-inch tortilla.  3 oz (85 g) cooked rice or pasta.  4 oz (113 g) cooked dried beans.  4 oz (113 g) starchy vegetable, such as peas, corn, or potatoes.  4 oz (113 g) hot cereal.  4 oz (113 g) mashed potatoes or  of a large baked potato.  4 oz (113 g) canned or frozen fruit.  4 oz (120 mL) fruit juice.  4-6 crackers.  6 chicken nuggets.  6 oz (170 g) unsweetened dry cereal.  6 oz (170 g) plain fat-free yogurt or yogurt sweetened with artificial sweeteners.  8 oz (240 mL) milk.  8 oz (170 g) fresh fruit or one small piece of fruit.  24 oz (680 g) popped popcorn.  Example of carbohydrate counting Sample meal  3  oz (85 g) chicken breast.  6 oz (170 g) brown rice.  4 oz (113 g) corn.  8 oz (240 mL) milk.  8 oz (170 g) strawberries with sugar-free whipped topping. Carbohydrate calculation 1. Identify the foods that contain carbohydrates: ? Rice. ? Corn. ? Milk. ? Strawberries. 2. Calculate how many servings you have of each food: ? 2 servings rice. ? 1 serving corn. ? 1 serving milk. ? 1 serving strawberries. 3. Multiply each number of servings by 15 g: ? 2 servings rice x 15 g = 30 g. ? 1 serving corn x 15 g = 15 g. ? 1 serving milk x 15 g = 15 g. ? 1 serving strawberries x 15 g = 15 g. 4. Add together all of the amounts to find the total grams of carbohydrates eaten: ? 30 g + 15 g + 15 g + 15 g = 75 g of carbohydrates total. This information is not intended to replace advice given to you by your health care provider. Make sure you discuss any questions you have with your health care provider. Document Released: 01/21/2005 Document Revised: 08/11/2015 Document Reviewed: 07/05/2015 Elsevier Interactive Patient Education  [employer].

## 2017-09-02 ENCOUNTER — Other Ambulatory Visit: Payer: BLUE CROSS/BLUE SHIELD

## 2017-12-03 DIAGNOSIS — M79671 Pain in right foot: Secondary | ICD-10-CM | POA: Diagnosis not present

## 2017-12-03 DIAGNOSIS — M722 Plantar fascial fibromatosis: Secondary | ICD-10-CM | POA: Diagnosis not present

## 2018-03-02 DIAGNOSIS — R7301 Impaired fasting glucose: Secondary | ICD-10-CM | POA: Diagnosis not present

## 2018-03-02 DIAGNOSIS — Z0000 Encounter for general adult medical examination without abnormal findings: Secondary | ICD-10-CM | POA: Diagnosis not present

## 2018-03-05 ENCOUNTER — Other Ambulatory Visit: Payer: Self-pay | Admitting: Family Medicine

## 2018-03-05 DIAGNOSIS — Z1231 Encounter for screening mammogram for malignant neoplasm of breast: Secondary | ICD-10-CM

## 2018-04-01 ENCOUNTER — Ambulatory Visit
Admission: RE | Admit: 2018-04-01 | Discharge: 2018-04-01 | Disposition: A | Discharge status: Home / Self Care | Payer: BLUE CROSS/BLUE SHIELD | Source: Ambulatory Visit | Attending: Family Medicine | Admitting: Family Medicine

## 2018-04-01 DIAGNOSIS — Z1231 Encounter for screening mammogram for malignant neoplasm of breast: Secondary | ICD-10-CM | POA: Diagnosis not present

## 2018-08-31 ENCOUNTER — Encounter: Payer: BLUE CROSS/BLUE SHIELD | Admitting: Women's Health

## 2019-05-03 ENCOUNTER — Other Ambulatory Visit: Payer: Self-pay | Admitting: Family Medicine

## 2019-05-03 DIAGNOSIS — Z1231 Encounter for screening mammogram for malignant neoplasm of breast: Secondary | ICD-10-CM

## 2019-05-05 ENCOUNTER — Ambulatory Visit
Admission: RE | Admit: 2019-05-05 | Discharge: 2019-05-05 | Disposition: A | Discharge status: Home / Self Care | Payer: BLUE CROSS/BLUE SHIELD | Source: Ambulatory Visit | Attending: Family Medicine | Admitting: Family Medicine

## 2019-05-05 ENCOUNTER — Other Ambulatory Visit: Payer: Self-pay

## 2019-05-05 DIAGNOSIS — Z1231 Encounter for screening mammogram for malignant neoplasm of breast: Secondary | ICD-10-CM

## 2019-06-24 IMAGING — MG DIGITAL SCREENING BILATERAL MAMMOGRAM WITH TOMO AND CAD
3 series · 3 of 11 positions shown · non-contrast
Comparison: None.

CLINICAL DATA: Screening.

EXAM:
DIGITAL SCREENING BILATERAL MAMMOGRAM WITH TOMO AND CAD

[R CC synth-2D]
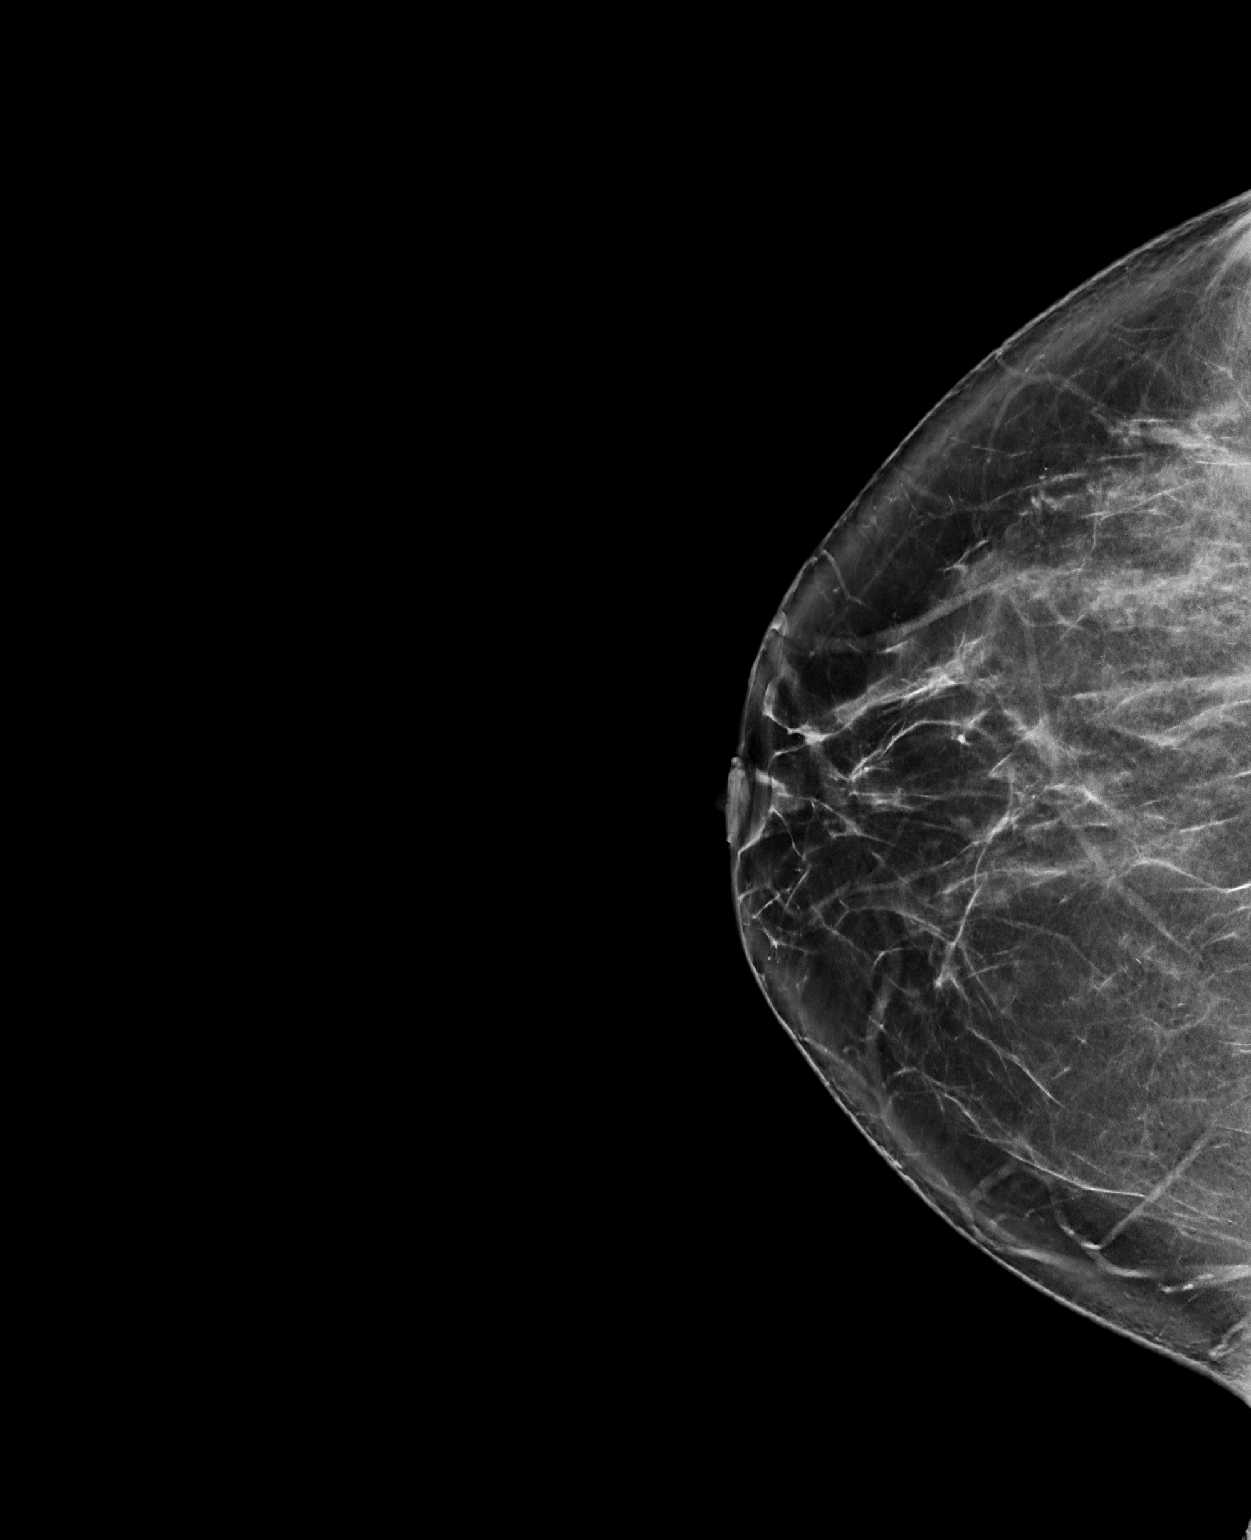

[R CC tomo · tomo slice 41/82.0]
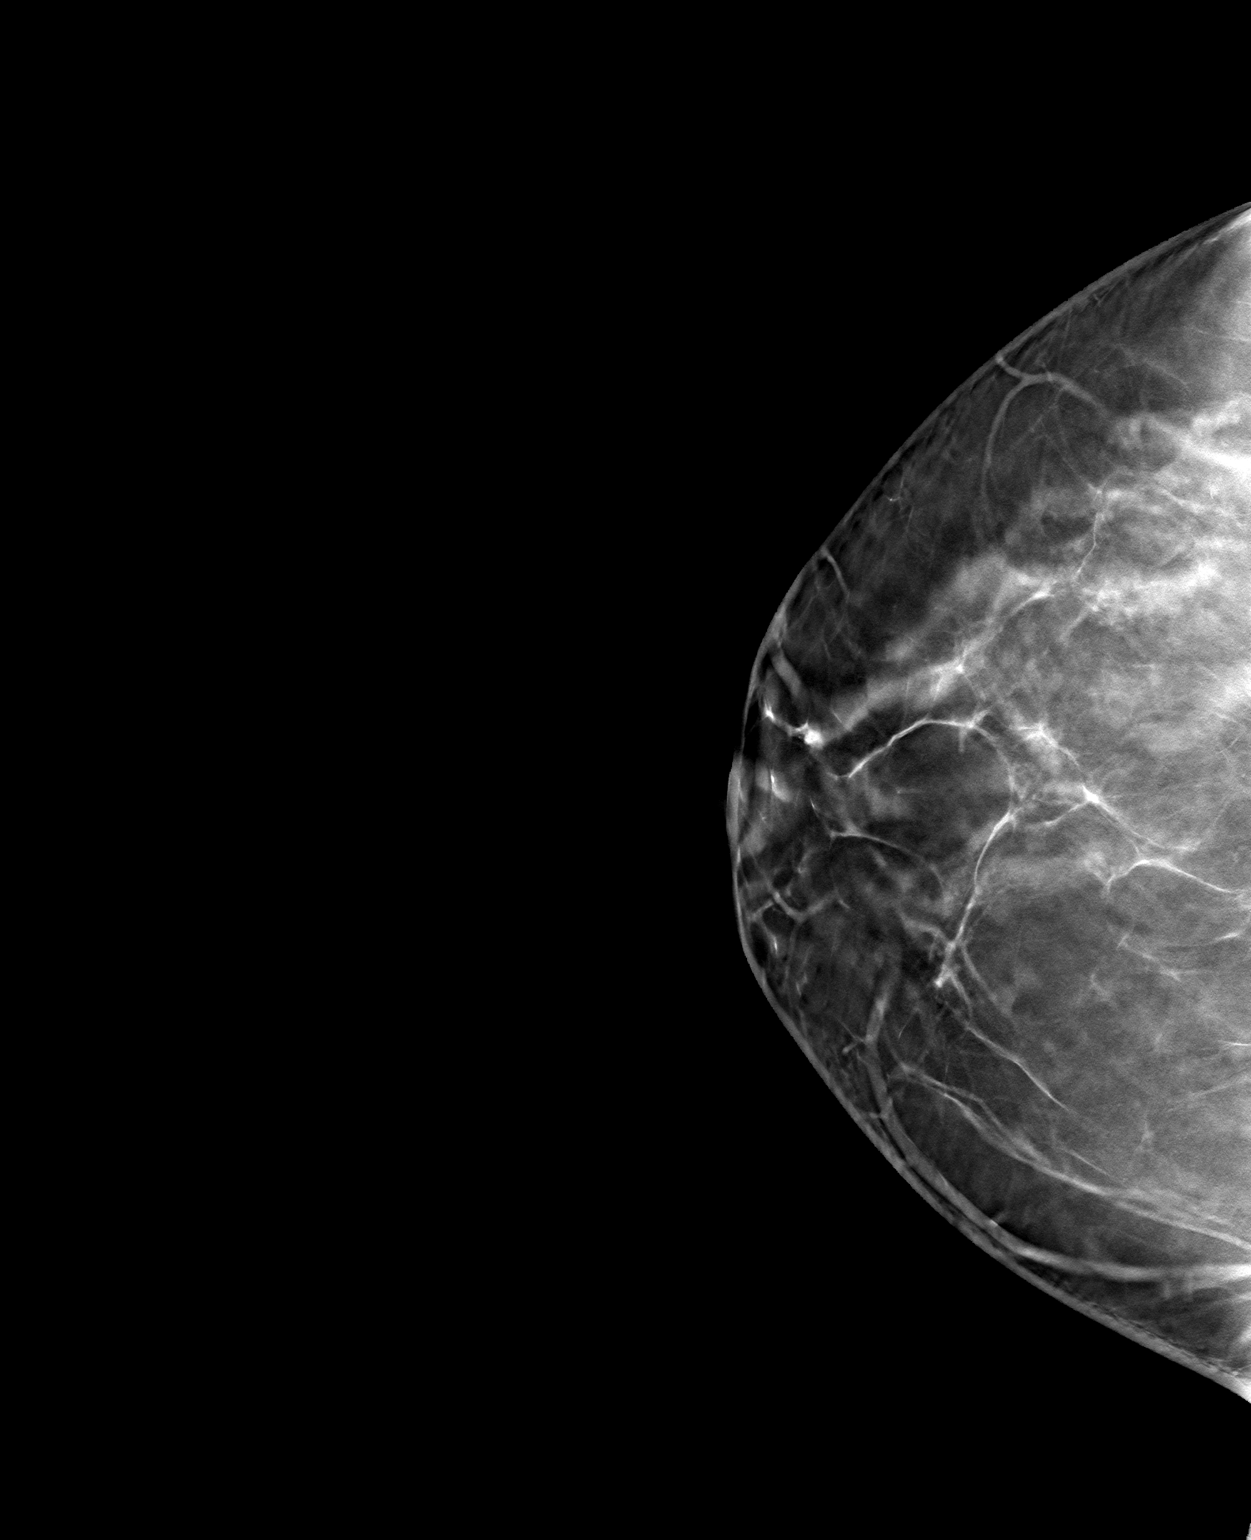

[L MLO tomo · tomo slice 50/99.0]
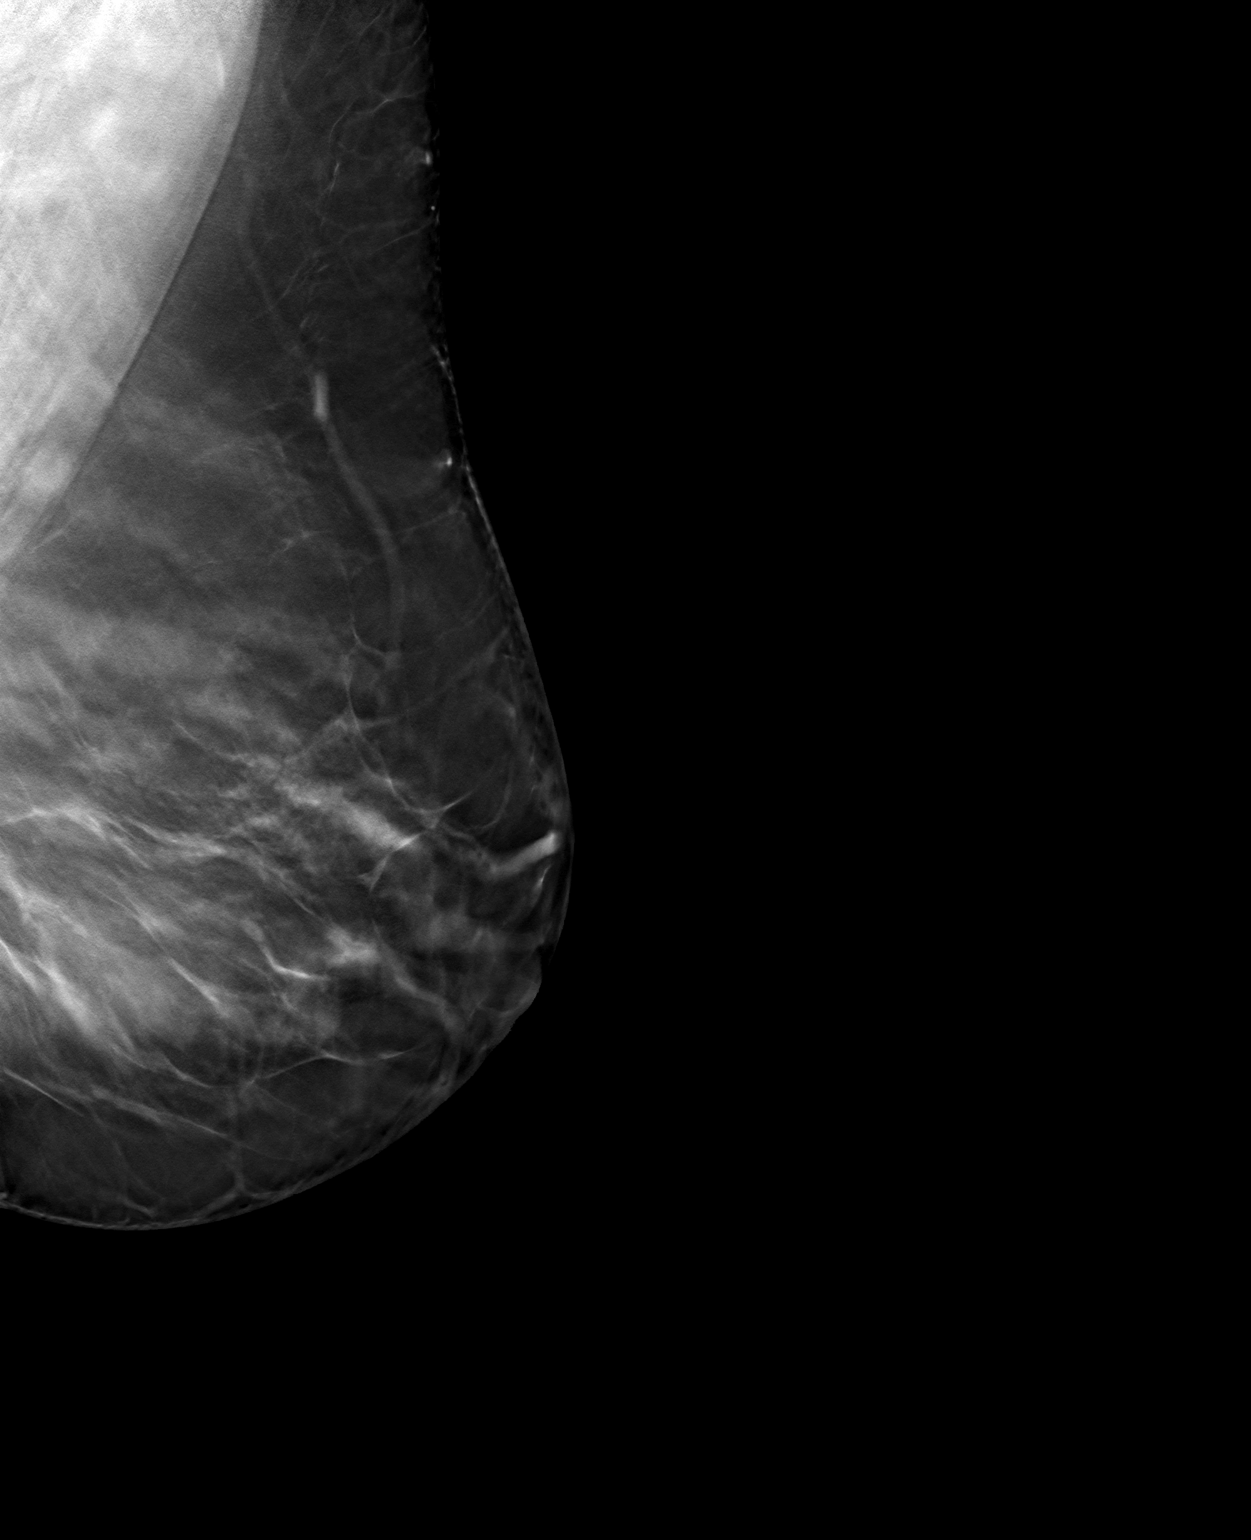

[3 of 11 positions shown; findings below may reference images not displayed]

ACR Breast Density Category b: There are scattered areas of
fibroglandular density.
FINDINGS: There are no findings suspicious for malignancy. Images were
processed with CAD.
IMPRESSION: No mammographic evidence of malignancy. A result letter of this
screening mammogram will be mailed directly to the patient.

RECOMMENDATION:
Screening mammogram in one year. (Code:[id number]-EJ6)

BI-RADS CATEGORY  1: Negative.

## 2020-04-11 ENCOUNTER — Other Ambulatory Visit: Payer: Self-pay | Admitting: Family Medicine

## 2020-04-11 DIAGNOSIS — Z1231 Encounter for screening mammogram for malignant neoplasm of breast: Secondary | ICD-10-CM

## 2020-06-05 ENCOUNTER — Other Ambulatory Visit: Payer: Self-pay | Admitting: Family Medicine

## 2020-06-05 ENCOUNTER — Inpatient Hospital Stay: Admission: RE | Admit: 2020-06-05 | Payer: BC Managed Care – PPO | Source: Ambulatory Visit

## 2020-06-05 DIAGNOSIS — Z1231 Encounter for screening mammogram for malignant neoplasm of breast: Secondary | ICD-10-CM

## 2020-08-02 ENCOUNTER — Other Ambulatory Visit: Payer: Self-pay

## 2020-08-02 ENCOUNTER — Ambulatory Visit
Admission: RE | Admit: 2020-08-02 | Discharge: 2020-08-02 | Disposition: A | Discharge status: Home / Self Care | Payer: BC Managed Care – PPO | Source: Ambulatory Visit | Attending: Family Medicine | Admitting: Family Medicine

## 2020-08-02 DIAGNOSIS — Z1231 Encounter for screening mammogram for malignant neoplasm of breast: Secondary | ICD-10-CM

## 2020-09-19 DIAGNOSIS — M25532 Pain in left wrist: Secondary | ICD-10-CM | POA: Diagnosis not present

## 2020-09-19 DIAGNOSIS — S6992XA Unspecified injury of left wrist, hand and finger(s), initial encounter: Secondary | ICD-10-CM | POA: Diagnosis not present

## 2020-09-21 DIAGNOSIS — M25532 Pain in left wrist: Secondary | ICD-10-CM | POA: Diagnosis not present

## 2020-09-21 DIAGNOSIS — Z01419 Encounter for gynecological examination (general) (routine) without abnormal findings: Secondary | ICD-10-CM | POA: Diagnosis not present

## 2020-09-21 DIAGNOSIS — Z124 Encounter for screening for malignant neoplasm of cervix: Secondary | ICD-10-CM | POA: Diagnosis not present

## 2020-09-21 LAB — RESULTS CONSOLE HPV: CHL HPV: NEGATIVE

## 2020-09-21 LAB — HM PAP SMEAR: HM Pap smear: NORMAL

## 2020-10-05 DIAGNOSIS — M25532 Pain in left wrist: Secondary | ICD-10-CM | POA: Diagnosis not present

## 2020-10-05 DIAGNOSIS — R8290 Unspecified abnormal findings in urine: Secondary | ICD-10-CM | POA: Diagnosis not present

## 2020-10-05 DIAGNOSIS — Z0000 Encounter for general adult medical examination without abnormal findings: Secondary | ICD-10-CM | POA: Diagnosis not present

## 2020-10-05 DIAGNOSIS — Z13220 Encounter for screening for lipoid disorders: Secondary | ICD-10-CM | POA: Diagnosis not present

## 2020-10-05 DIAGNOSIS — M67834 Other specified disorders of tendon, left wrist: Secondary | ICD-10-CM | POA: Diagnosis not present

## 2020-10-05 DIAGNOSIS — Z0001 Encounter for general adult medical examination with abnormal findings: Secondary | ICD-10-CM | POA: Diagnosis not present

## 2020-10-05 DIAGNOSIS — M65832 Other synovitis and tenosynovitis, left forearm: Secondary | ICD-10-CM | POA: Diagnosis not present

## 2020-10-05 DIAGNOSIS — Z6841 Body Mass Index (BMI) 40.0 and over, adult: Secondary | ICD-10-CM | POA: Diagnosis not present

## 2020-10-05 DIAGNOSIS — Z1321 Encounter for screening for nutritional disorder: Secondary | ICD-10-CM | POA: Diagnosis not present

## 2020-10-25 DIAGNOSIS — S63092A Other subluxation of left wrist and hand, initial encounter: Secondary | ICD-10-CM | POA: Diagnosis not present

## 2020-10-25 DIAGNOSIS — M25532 Pain in left wrist: Secondary | ICD-10-CM | POA: Diagnosis not present

## 2020-10-25 DIAGNOSIS — M778 Other enthesopathies, not elsewhere classified: Secondary | ICD-10-CM | POA: Diagnosis not present

## 2020-11-06 DIAGNOSIS — M25532 Pain in left wrist: Secondary | ICD-10-CM | POA: Diagnosis not present

## 2020-11-06 DIAGNOSIS — M778 Other enthesopathies, not elsewhere classified: Secondary | ICD-10-CM | POA: Diagnosis not present

## 2020-11-06 DIAGNOSIS — Z0389 Encounter for observation for other suspected diseases and conditions ruled out: Secondary | ICD-10-CM | POA: Diagnosis not present

## 2020-11-22 DIAGNOSIS — M778 Other enthesopathies, not elsewhere classified: Secondary | ICD-10-CM | POA: Diagnosis not present

## 2020-11-22 DIAGNOSIS — M25532 Pain in left wrist: Secondary | ICD-10-CM | POA: Diagnosis not present

## 2020-11-29 DIAGNOSIS — M778 Other enthesopathies, not elsewhere classified: Secondary | ICD-10-CM | POA: Diagnosis not present

## 2020-11-29 DIAGNOSIS — M25532 Pain in left wrist: Secondary | ICD-10-CM | POA: Diagnosis not present

## 2020-11-29 DIAGNOSIS — Z789 Other specified health status: Secondary | ICD-10-CM | POA: Diagnosis not present

## 2020-11-29 DIAGNOSIS — R531 Weakness: Secondary | ICD-10-CM | POA: Diagnosis not present

## 2020-12-06 DIAGNOSIS — Z789 Other specified health status: Secondary | ICD-10-CM | POA: Diagnosis not present

## 2020-12-06 DIAGNOSIS — M25532 Pain in left wrist: Secondary | ICD-10-CM | POA: Diagnosis not present

## 2020-12-06 DIAGNOSIS — R531 Weakness: Secondary | ICD-10-CM | POA: Diagnosis not present

## 2020-12-06 DIAGNOSIS — M778 Other enthesopathies, not elsewhere classified: Secondary | ICD-10-CM | POA: Diagnosis not present

## 2020-12-15 DIAGNOSIS — M25532 Pain in left wrist: Secondary | ICD-10-CM | POA: Diagnosis not present

## 2020-12-15 DIAGNOSIS — Z789 Other specified health status: Secondary | ICD-10-CM | POA: Diagnosis not present

## 2020-12-15 DIAGNOSIS — M778 Other enthesopathies, not elsewhere classified: Secondary | ICD-10-CM | POA: Diagnosis not present

## 2020-12-15 DIAGNOSIS — R531 Weakness: Secondary | ICD-10-CM | POA: Diagnosis not present

## 2020-12-18 DIAGNOSIS — R531 Weakness: Secondary | ICD-10-CM | POA: Diagnosis not present

## 2020-12-18 DIAGNOSIS — M25532 Pain in left wrist: Secondary | ICD-10-CM | POA: Diagnosis not present

## 2020-12-18 DIAGNOSIS — M778 Other enthesopathies, not elsewhere classified: Secondary | ICD-10-CM | POA: Diagnosis not present

## 2020-12-18 DIAGNOSIS — Z789 Other specified health status: Secondary | ICD-10-CM | POA: Diagnosis not present

## 2020-12-25 DIAGNOSIS — Z789 Other specified health status: Secondary | ICD-10-CM | POA: Diagnosis not present

## 2020-12-25 DIAGNOSIS — R531 Weakness: Secondary | ICD-10-CM | POA: Diagnosis not present

## 2020-12-25 DIAGNOSIS — M25532 Pain in left wrist: Secondary | ICD-10-CM | POA: Diagnosis not present

## 2020-12-25 DIAGNOSIS — M778 Other enthesopathies, not elsewhere classified: Secondary | ICD-10-CM | POA: Diagnosis not present

## 2021-01-01 DIAGNOSIS — M778 Other enthesopathies, not elsewhere classified: Secondary | ICD-10-CM | POA: Diagnosis not present

## 2021-01-01 DIAGNOSIS — Z789 Other specified health status: Secondary | ICD-10-CM | POA: Diagnosis not present

## 2021-01-01 DIAGNOSIS — M25532 Pain in left wrist: Secondary | ICD-10-CM | POA: Diagnosis not present

## 2021-01-01 DIAGNOSIS — R531 Weakness: Secondary | ICD-10-CM | POA: Diagnosis not present

## 2021-01-03 DIAGNOSIS — M778 Other enthesopathies, not elsewhere classified: Secondary | ICD-10-CM | POA: Diagnosis not present

## 2021-01-03 DIAGNOSIS — Z789 Other specified health status: Secondary | ICD-10-CM | POA: Diagnosis not present

## 2021-01-03 DIAGNOSIS — M659 Synovitis and tenosynovitis, unspecified: Secondary | ICD-10-CM | POA: Diagnosis not present

## 2021-01-03 DIAGNOSIS — R531 Weakness: Secondary | ICD-10-CM | POA: Diagnosis not present

## 2021-01-03 DIAGNOSIS — M25532 Pain in left wrist: Secondary | ICD-10-CM | POA: Diagnosis not present

## 2021-01-10 DIAGNOSIS — M25532 Pain in left wrist: Secondary | ICD-10-CM | POA: Diagnosis not present

## 2021-01-10 DIAGNOSIS — R531 Weakness: Secondary | ICD-10-CM | POA: Diagnosis not present

## 2021-01-10 DIAGNOSIS — M659 Synovitis and tenosynovitis, unspecified: Secondary | ICD-10-CM | POA: Diagnosis not present

## 2021-01-10 DIAGNOSIS — M778 Other enthesopathies, not elsewhere classified: Secondary | ICD-10-CM | POA: Diagnosis not present

## 2021-02-14 DIAGNOSIS — M778 Other enthesopathies, not elsewhere classified: Secondary | ICD-10-CM | POA: Diagnosis not present

## 2021-02-14 DIAGNOSIS — M659 Synovitis and tenosynovitis, unspecified: Secondary | ICD-10-CM | POA: Diagnosis not present

## 2021-02-14 DIAGNOSIS — M25532 Pain in left wrist: Secondary | ICD-10-CM | POA: Diagnosis not present

## 2021-02-14 DIAGNOSIS — S6982XD Other specified injuries of left wrist, hand and finger(s), subsequent encounter: Secondary | ICD-10-CM | POA: Diagnosis not present

## 2021-02-14 DIAGNOSIS — Z789 Other specified health status: Secondary | ICD-10-CM | POA: Diagnosis not present

## 2021-03-07 DIAGNOSIS — M778 Other enthesopathies, not elsewhere classified: Secondary | ICD-10-CM | POA: Diagnosis not present

## 2021-03-07 DIAGNOSIS — R531 Weakness: Secondary | ICD-10-CM | POA: Diagnosis not present

## 2021-03-07 DIAGNOSIS — M25532 Pain in left wrist: Secondary | ICD-10-CM | POA: Diagnosis not present

## 2021-03-07 DIAGNOSIS — M659 Synovitis and tenosynovitis, unspecified: Secondary | ICD-10-CM | POA: Diagnosis not present

## 2021-03-12 DIAGNOSIS — M659 Synovitis and tenosynovitis, unspecified: Secondary | ICD-10-CM | POA: Diagnosis not present

## 2021-03-12 DIAGNOSIS — R531 Weakness: Secondary | ICD-10-CM | POA: Diagnosis not present

## 2021-03-12 DIAGNOSIS — M778 Other enthesopathies, not elsewhere classified: Secondary | ICD-10-CM | POA: Diagnosis not present

## 2021-03-12 DIAGNOSIS — M25532 Pain in left wrist: Secondary | ICD-10-CM | POA: Diagnosis not present

## 2021-03-14 DIAGNOSIS — R531 Weakness: Secondary | ICD-10-CM | POA: Diagnosis not present

## 2021-03-14 DIAGNOSIS — M25532 Pain in left wrist: Secondary | ICD-10-CM | POA: Diagnosis not present

## 2021-03-14 DIAGNOSIS — M778 Other enthesopathies, not elsewhere classified: Secondary | ICD-10-CM | POA: Diagnosis not present

## 2021-03-14 DIAGNOSIS — M659 Synovitis and tenosynovitis, unspecified: Secondary | ICD-10-CM | POA: Diagnosis not present

## 2021-03-26 DIAGNOSIS — R531 Weakness: Secondary | ICD-10-CM | POA: Diagnosis not present

## 2021-03-26 DIAGNOSIS — M25532 Pain in left wrist: Secondary | ICD-10-CM | POA: Diagnosis not present

## 2021-03-26 DIAGNOSIS — M778 Other enthesopathies, not elsewhere classified: Secondary | ICD-10-CM | POA: Diagnosis not present

## 2021-03-26 DIAGNOSIS — Z789 Other specified health status: Secondary | ICD-10-CM | POA: Diagnosis not present

## 2021-06-22 ENCOUNTER — Other Ambulatory Visit: Payer: Self-pay | Admitting: Family Medicine

## 2021-06-22 DIAGNOSIS — Z1231 Encounter for screening mammogram for malignant neoplasm of breast: Secondary | ICD-10-CM

## 2021-08-03 ENCOUNTER — Ambulatory Visit
Admission: RE | Admit: 2021-08-03 | Discharge: 2021-08-03 | Disposition: A | Discharge status: Home / Self Care | Payer: BC Managed Care – PPO | Source: Ambulatory Visit | Attending: Family Medicine | Admitting: Family Medicine

## 2021-08-03 DIAGNOSIS — Z1231 Encounter for screening mammogram for malignant neoplasm of breast: Secondary | ICD-10-CM

## 2021-10-23 NOTE — Progress Notes (Unsigned)
   New Patient Visit  There were no vitals taken for this visit.   Subjective:    Patient ID: Kayla Zimmerman, female    DOB: 1975-05-28, 46 y.o.   MRN: [id number]  CC: No chief complaint on file.   HPI: Kayla Zimmerman is a 46 y.o. female presents for new patient visit to establish care.  Introduced to Designer, jewellery role and practice setting.  All questions answered.  Discussed provider/patient relationship and expectations.   Past Medical History:  Diagnosis Date   Chronic back pain    Depression    Ganglion of joint    MVP (mitral valve prolapse)     Past Surgical History:  Procedure Laterality Date   WISDOM TOOTH EXTRACTION      Family History  Problem Relation Age of Onset   Mitral valve prolapse Mother    Breast cancer Maternal Aunt    Breast cancer Paternal Aunt    Mitral valve prolapse Maternal Grandfather    Diabetes Paternal Grandfather    Hypertension Paternal Grandfather      Social History   Tobacco Use   Smoking status: Never   Smokeless tobacco: Never  Vaping Use   Vaping Use: Never used  Substance Use Topics   Alcohol use: No   Drug use: No    Current Outpatient Medications on File Prior to Visit  Medication Sig Dispense Refill   ibuprofen (ADVIL,MOTRIN) 600 MG tablet Take 1 tablet (600 mg total) by mouth every 6 (six) hours as needed. 30 tablet 0   terconazole (TERAZOL 7) 0.4 % vaginal cream Place 1 applicator vaginally at bedtime. 45 g 0   No current facility-administered medications on file prior to visit.     Review of Systems      Objective:    There were no vitals taken for this visit.  Wt Readings from Last 3 Encounters:  08/26/17 220 lb (99.8 kg)  10/19/14 210 lb (95.3 kg)  07/22/13 206 lb (93.4 kg)    BP Readings from Last 3 Encounters:  08/26/17 122/80  10/19/14 158/69  07/22/13 136/66    Physical Exam     Assessment & Plan:   Problem List Items Addressed This Visit   None    Follow up plan: No follow-ups  on file.

## 2021-10-24 ENCOUNTER — Encounter: Payer: Self-pay | Admitting: Nurse Practitioner

## 2021-10-24 ENCOUNTER — Ambulatory Visit: Payer: BC Managed Care – PPO | Admitting: Nurse Practitioner

## 2021-10-24 VITALS — BP 130/80 | HR 86 | Temp 97.0°F | Ht 62.0 in | Wt 240.4 lb

## 2021-10-24 DIAGNOSIS — M6740 Ganglion, unspecified site: Secondary | ICD-10-CM | POA: Diagnosis not present

## 2021-10-24 DIAGNOSIS — G5622 Lesion of ulnar nerve, left upper limb: Secondary | ICD-10-CM | POA: Insufficient documentation

## 2021-10-24 DIAGNOSIS — F321 Major depressive disorder, single episode, moderate: Secondary | ICD-10-CM | POA: Diagnosis not present

## 2021-10-24 DIAGNOSIS — Z1211 Encounter for screening for malignant neoplasm of colon: Secondary | ICD-10-CM

## 2021-10-24 DIAGNOSIS — Z1212 Encounter for screening for malignant neoplasm of rectum: Secondary | ICD-10-CM

## 2021-10-24 DIAGNOSIS — G43829 Menstrual migraine, not intractable, without status migrainosus: Secondary | ICD-10-CM | POA: Diagnosis not present

## 2021-10-24 MED ORDER — PHENTERMINE HCL 15 MG PO CAPS: 15.0000 mg | ORAL_CAPSULE | ORAL | 0 refills | Status: DC

## 2021-10-24 MED ORDER — ESCITALOPRAM OXALATE 10 MG PO TABS: 10.0000 mg | ORAL_TABLET | Freq: Every day | ORAL | 1 refills | Status: DC

## 2021-10-24 NOTE — Patient Instructions (Addendum)
It was great to see you!  Start lexapro 1 tablet daily for your depression. You may notice nausea and a headache that should go away in a few days.   Start phentermine 1 capsule daily in the morning with food. This may make you jittery. Stop if you have chest pain.   Start wearing an elbow strap during the day to help with your pain.   Let's follow-up in 4 weeks, sooner if you have concerns.  If a referral was placed today, you will be contacted for an appointment. Please note that routine referrals can sometimes take up to 3-4 weeks to process. Please call our office if you haven't heard anything after this time frame.  Take care,  Vance Peper, NP

## 2021-10-24 NOTE — Assessment & Plan Note (Signed)
Chronic, not controlled.  She states that her symptoms tend to wax and wane.  Right now she is currently experiencing a low.  She denies SI/HI.  Her PHQ-9 is a 14 today.  She has tried Wellbutrin and Zoloft in the past which she did not like the way they made her feel.  We will have her start Lexapro 10 mg daily.  Discussed possible side effects.  Follow-up in 4 to 6 weeks.

## 2021-10-24 NOTE — Assessment & Plan Note (Signed)
She has a history of a ganglionic cyst on her wrist and is now having 1 at the base of her left pinky finger.  She would like to keep an eye on it for now, consider referral to orthopedics if worsens.

## 2021-10-24 NOTE — Assessment & Plan Note (Signed)
Her symptoms are consistent with cubital tunnel on the left arm.  She can try wearing a elbow strap to see if this helps with some of the discomfort.  She can also try keeping her arm straight at night while sleeping.  Continue doing stretches that she discussed at physical therapy.  Follow-up if symptoms worsen or do not improve.

## 2021-10-24 NOTE — Assessment & Plan Note (Signed)
Chronic, stable.  She states that she gets migraines at different points throughout her menstrual goal.  She will take ibuprofen or Tylenol as needed for pain.  She has tried birth control in the past, however this actually made it worse.  Follow-up as symptoms worsen or with any concerns.

## 2021-10-24 NOTE — Assessment & Plan Note (Signed)
BMI is 43.9.  We have discussed nutrition, exercise.  She states that she tends to eat well overall, she cooks at home, and focuses on fruits vegetables and protein.  She has also been doing a weighted hula hoop for exercise.  She would like some help with weight loss, she does not think she can give herself an injection as she has tried this in the past.  We will have her start phentermine 15 mg daily.  Discussed possible side effects.  PDMP reviewed.  Referral also placed to healthy weight and wellness.  Follow-up in 4 weeks

## 2021-11-07 ENCOUNTER — Ambulatory Visit: Payer: BC Managed Care – PPO | Admitting: Bariatrics

## 2021-11-07 ENCOUNTER — Encounter: Payer: Self-pay | Admitting: Family Medicine

## 2021-11-07 ENCOUNTER — Ambulatory Visit: Payer: BC Managed Care – PPO | Admitting: Family Medicine

## 2021-11-07 VITALS — BP 122/84 | HR 79 | Temp 97.4°F | Wt 235.6 lb

## 2021-11-07 DIAGNOSIS — H66001 Acute suppurative otitis media without spontaneous rupture of ear drum, right ear: Secondary | ICD-10-CM | POA: Diagnosis not present

## 2021-11-07 DIAGNOSIS — H6121 Impacted cerumen, right ear: Secondary | ICD-10-CM | POA: Diagnosis not present

## 2021-11-07 MED ORDER — FLUTICASONE PROPIONATE 50 MCG/ACT NA SUSP: 2.0000 | Freq: Every day | NASAL | 6 refills | Status: DC

## 2021-11-07 MED ORDER — AMOXICILLIN-POT CLAVULANATE 875-125 MG PO TABS: 1.0000 | ORAL_TABLET | Freq: Two times a day (BID) | ORAL | 0 refills | Status: DC

## 2021-11-07 NOTE — Progress Notes (Signed)
Assessment/Plan:   Problem List Items Addressed This Visit   None Visit Diagnoses     Hearing loss due to cerumen impaction, right    -  Primary   Non-recurrent acute suppurative otitis media of right ear without spontaneous rupture of tympanic membrane       Relevant Medications   amoxicillin-clavulanate (AUGMENTIN) 875-125 MG tablet   fluticasone (FLONASE) 50 MCG/ACT nasal spray          Subjective:  HPI:  Kayla Zimmerman is a 46 y.o. female who has Ganglion of joint; Unspecified disorder of lipoid metabolism; Impaired fasting Fayad sugar; Morbid obesity ([place]); Menstrual migraine without status migrainosus, not intractable; Depression, major, single episode, moderate (HCC); and Cubital tunnel syndrome on left on their problem list..   She  has a past medical history of Chronic back pain, Depression, Ganglion of joint, Migraines, and MVP (mitral valve prolapse)..   She presents with chief complaint of Otalgia (Right ear pain x 3 days) .  Ear Pain: Patient presents with right ear pain.  Symptoms include congestion, cough, and reports changes in hearing on right side like being in "tunnel", milded dizziness when standing or sitting down  . Symptoms began 3 days ago and are unchanged since that time. Pain level is "pretty bad" and "severe". Pain is worse with sneezing, yawning, and blowing nose. Patient denies chills, dyspnea, fever, myalgias, wheezing, and draining out of ear, vision changes, falls, difficulty swallowin or changes in mentation . She tried cleaning ear out and using heating pad, but no improvement.  Ear history: 1 previous ear infections, 20+ years ago. Has not tried ibuprofen.    Past Surgical History:  Procedure Laterality Date   WISDOM TOOTH EXTRACTION      Outpatient Medications Prior to Visit  Medication Sig Dispense Refill   escitalopram (LEXAPRO) 10 MG tablet Take 1 tablet (10 mg total) by mouth daily. 30 tablet 1   Multiple Vitamin (MULTI-VITAMINS) TABS  Take 1 tablet by mouth daily.     phentermine 15 MG capsule Take 1 capsule (15 mg total) by mouth every morning. 30 capsule 0   No facility-administered medications prior to visit.    Family History  Problem Relation Age of Onset   Cancer Mother        leukemia   Mitral valve prolapse Mother    Cancer Father        colon cancer   Breast cancer Maternal Aunt    Breast cancer Paternal Aunt    Mitral valve prolapse Maternal Grandfather    Cancer Paternal Grandfather        skin, prostate   Diabetes Paternal Grandfather    Hypertension Paternal Grandfather     Social History   Socioeconomic History   Marital status: Married    Spouse name: Not on file   Number of children: Not on file   Years of education: Not on file   Highest education level: Not on file  Occupational History   Not on file  Tobacco Use   Smoking status: Never   Smokeless tobacco: Never  Vaping Use   Vaping Use: Never used  Substance and Sexual Activity   Alcohol use: Never   Drug use: Never   Sexual activity: Yes    Partners: Male    Birth control/protection: Surgical    Comment: husband vasectomy  Other Topics Concern   Not on file  Social History Narrative   Not on file   Social Determinants of Health  Financial Resource Strain: Not on file  Food Insecurity: Not on file  Transportation Needs: Not on file  Physical Activity: Not on file  Stress: Not on file  Social Connections: Not on file  Intimate Partner Violence: Not on file                                                                                                 Objective:  Physical Exam: BP 122/84 (BP Location: Left Arm, Patient Position: Sitting, Cuff Size: Large)   Pulse 79   Temp (!) 97.4 F (36.3 C) (Temporal)   Wt 235 lb 9.6 oz (106.9 kg)   LMP 10/15/2021   SpO2 98%   BMI 43.09 kg/m    General: No acute distress. Awake and conversant.  Eyes: Normal conjunctiva, anicteric. Round symmetric pupils.  ENT: No  nasal discharge.  Neck: Neck is supple. No masses or thyromegaly.  Respiratory: Respirations are non-labored. No auditory wheezing.  Skin: Warm. No rashes or ulcers.  Psych: Alert and oriented. Cooperative, Appropriate mood and affect, Normal judgment.  CV: No cyanosis or JVD MSK: Normal ambulation. No clubbing  Neuro: Sensation and CN II-XII grossly normal.    Ceruminosis is noted in right ear.  Wax is removed by syringing and manual debridement.  Tolerated procedure well patient with improvement in right ear hearing and discomfort.   Reevaluation of right ear showed erythema and effusion present for clear out.     Garner Nash, MD, MS

## 2021-11-08 DIAGNOSIS — Z1212 Encounter for screening for malignant neoplasm of rectum: Secondary | ICD-10-CM | POA: Diagnosis not present

## 2021-11-08 DIAGNOSIS — Z1211 Encounter for screening for malignant neoplasm of colon: Secondary | ICD-10-CM | POA: Diagnosis not present

## 2021-11-15 LAB — COLOGUARD: COLOGUARD: NEGATIVE

## 2021-11-15 NOTE — Progress Notes (Signed)
Called and informed patient of results and provider instructions. Patient voiced understanding. Sw, cma

## 2021-11-21 ENCOUNTER — Ambulatory Visit (INDEPENDENT_AMBULATORY_CARE_PROVIDER_SITE_OTHER): Payer: BC Managed Care – PPO | Admitting: Nurse Practitioner

## 2021-11-21 ENCOUNTER — Encounter: Payer: Self-pay | Admitting: Nurse Practitioner

## 2021-11-21 VITALS — BP 118/78 | HR 77 | Temp 96.6°F | Wt 235.0 lb

## 2021-11-21 DIAGNOSIS — M6740 Ganglion, unspecified site: Secondary | ICD-10-CM | POA: Diagnosis not present

## 2021-11-21 DIAGNOSIS — Z0000 Encounter for general adult medical examination without abnormal findings: Secondary | ICD-10-CM | POA: Diagnosis not present

## 2021-11-21 DIAGNOSIS — F321 Major depressive disorder, single episode, moderate: Secondary | ICD-10-CM

## 2021-11-21 LAB — POCT URINALYSIS DIPSTICK
Bilirubin, UA: NEGATIVE
Blood, UA: NEGATIVE
Glucose, UA: NEGATIVE
Ketones, UA: NEGATIVE
Leukocytes, UA: NEGATIVE
Nitrite, UA: NEGATIVE
Protein, UA: NEGATIVE
Spec Grav, UA: 1.01 (ref 1.010–1.025)
Urobilinogen, UA: NEGATIVE E.U./dL — AB
pH, UA: 8 (ref 5.0–8.0)

## 2021-11-21 MED ORDER — ESCITALOPRAM OXALATE 10 MG PO TABS: 10.0000 mg | ORAL_TABLET | Freq: Every day | ORAL | 1 refills | Status: DC

## 2021-11-21 MED ORDER — PHENTERMINE HCL 15 MG PO CAPS: 15.0000 mg | ORAL_CAPSULE | ORAL | 0 refills | Status: DC

## 2021-11-21 NOTE — Progress Notes (Signed)
BP 118/78 (BP Location: Right Arm, Cuff Size: Large)   Pulse 77   Temp (!) 96.6 F (35.9 C) (Temporal)   Wt 235 lb (106.6 kg)   LMP 10/15/2021   SpO2 97%   BMI 42.98 kg/m    Subjective:    Patient ID: Kayla Zimmerman, female    DOB: February 01, 1976, 46 y.o.   MRN: [id number]  CC: Chief Complaint  Patient presents with   Annual Exam    CPE   HPI: Kayla Zimmerman is a 46 y.o. female presenting on 11/21/2021 for comprehensive medical examination. Current medical complaints include: depression  Last visit she was started on lexapro 10mg  daily for her depression. She states that the medication is helping and she is not having any side effects from it.   She currently lives with: husband, 2 kids Menopausal Symptoms: no  Depression Screen done today and results listed below:     11/21/2021    1:39 PM 10/24/2021   11:09 AM  Depression screen PHQ 2/9  Decreased Interest 1 2  Down, Depressed, Hopeless 1 2  PHQ - 2 Score 2 4  Altered sleeping 1 3  Tired, decreased energy 0 2  Change in appetite 0 1  Feeling bad or failure about yourself  1 2  Trouble concentrating 0 2  Moving slowly or fidgety/restless 0 0  Suicidal thoughts 0 0  PHQ-9 Score 4 14  Difficult doing work/chores Somewhat difficult Very difficult    The patient does not have a history of falls. I did not complete a risk assessment for falls. A plan of care for falls was not documented.   Past Medical History:  Past Medical History:  Diagnosis Date   Chronic back pain    Depression    Ganglion of joint    Migraines    MVP (mitral valve prolapse)     Surgical History:  Past Surgical History:  Procedure Laterality Date   WISDOM TOOTH EXTRACTION      Medications:  Current Outpatient Medications on File Prior to Visit  Medication Sig   fluticasone (FLONASE) 50 MCG/ACT nasal spray Place 2 sprays into both nostrils daily.   Multiple Vitamin (MULTI-VITAMINS) TABS Take 1 tablet by mouth daily.   No current  facility-administered medications on file prior to visit.    Allergies:  Allergies  Allergen Reactions   Dexamethasone Sodium Phosphate Rash    Social History:  Social History   Socioeconomic History   Marital status: Married    Spouse name: Not on file   Number of children: Not on file   Years of education: Not on file   Highest education level: Not on file  Occupational History   Not on file  Tobacco Use   Smoking status: Never   Smokeless tobacco: Never  Vaping Use   Vaping Use: Never used  Substance and Sexual Activity   Alcohol use: Never   Drug use: Never   Sexual activity: Yes    Partners: Male    Birth control/protection: Surgical    Comment: husband vasectomy  Other Topics Concern   Not on file  Social History Narrative   Not on file   Social Determinants of Health   Financial Resource Strain: Not on file  Food Insecurity: Not on file  Transportation Needs: Not on file  Physical Activity: Not on file  Stress: Not on file  Social Connections: Not on file  Intimate Partner Violence: Not on file   Social History  Tobacco Use  Smoking Status Never  Smokeless Tobacco Never   Social History   Substance and Sexual Activity  Alcohol Use Never    Family History:  Family History  Problem Relation Age of Onset   Cancer Mother        leukemia   Mitral valve prolapse Mother    Cancer Father        colon cancer   Breast cancer Maternal Aunt    Breast cancer Paternal Aunt    Mitral valve prolapse Maternal Grandfather    Cancer Paternal Grandfather        skin, prostate   Diabetes Paternal Grandfather    Hypertension Paternal Grandfather     Past medical history, surgical history, medications, allergies, family history and social history reviewed with patient today and changes made to appropriate areas of the chart.   Review of Systems  Constitutional: Negative.   HENT: Negative.    Respiratory: Negative.    Cardiovascular: Negative.    Gastrointestinal: Negative.   Genitourinary: Negative.   Musculoskeletal:        Right hand pain at base of pinky  Skin: Negative.   Neurological: Negative.   Psychiatric/Behavioral: Negative.     All other ROS negative except what is listed above and in the HPI.      Objective:    BP 118/78 (BP Location: Right Arm, Cuff Size: Large)   Pulse 77   Temp (!) 96.6 F (35.9 C) (Temporal)   Wt 235 lb (106.6 kg)   LMP 10/15/2021   SpO2 97%   BMI 42.98 kg/m   Wt Readings from Last 3 Encounters:  11/21/21 235 lb (106.6 kg)  11/07/21 235 lb 9.6 oz (106.9 kg)  10/24/21 240 lb 6.4 oz (109 kg)    Physical Exam Vitals and nursing note reviewed.  Constitutional:      General: She is not in acute distress.    Appearance: Normal appearance.  HENT:     Head: Normocephalic and atraumatic.     Right Ear: Tympanic membrane, ear canal and external ear normal.     Left Ear: Tympanic membrane, ear canal and external ear normal.  Eyes:     Conjunctiva/sclera: Conjunctivae normal.  Cardiovascular:     Rate and Rhythm: Normal rate and regular rhythm.     Pulses: Normal pulses.     Heart sounds: Normal heart sounds.  Pulmonary:     Effort: Pulmonary effort is normal.     Breath sounds: Normal breath sounds.  Abdominal:     Palpations: Abdomen is soft.     Tenderness: There is no abdominal tenderness.  Musculoskeletal:        General: Normal range of motion.     Cervical back: Normal range of motion and neck supple. No tenderness.     Right lower leg: No edema.     Left lower leg: No edema.  Lymphadenopathy:     Cervical: No cervical adenopathy.  Skin:    General: Skin is warm and dry.  Neurological:     General: No focal deficit present.     Mental Status: She is alert and oriented to person, place, and time.     Cranial Nerves: No cranial nerve deficit.     Coordination: Coordination normal.     Gait: Gait normal.  Psychiatric:        Mood and Affect: Mood normal.         Behavior: Behavior normal.  Thought Content: Thought content normal.        Judgment: Judgment normal.     Results for orders placed or performed in visit on 11/21/21  POCT urinalysis dipstick  Result Value Ref Range   Color, UA yellow    Clarity, UA cloudy    Glucose, UA Negative Negative   Bilirubin, UA negativve    Ketones, UA negative    Spec Grav, UA 1.010 1.010 - 1.025   Valeri, UA negative    pH, UA 8.0 5.0 - 8.0   Protein, UA Negative Negative   Urobilinogen, UA negative (A) 0.2 or 1.0 E.U./dL   Nitrite, UA negative    Leukocytes, UA Negative Negative   Appearance     Odor        Assessment & Plan:   Problem List Items Addressed This Visit       Musculoskeletal and Integument   Ganglion of joint    Noted to the base of her right pinky finger.  She states that is gotten slightly bigger, however would like to hold off on referral to orthopedics at this time.        Other   Morbid obesity ([place])    She is lost 5 pounds since her last visit, congratulated her on this!  She is taking her phentermine 15 mg daily without any side effects.  She has noticed some appetite suppression while taking this medication.  She has an appointment tomorrow with [facility] for an informational session.  Continue nutrition and exercise. Follow-up in 2 months.       Relevant Medications   phentermine 15 MG capsule   Depression, major, single episode, moderate (HCC)    Chronic, improving.  She states that the Lexapro is really helping her symptoms.  She denies SI/HI.  Her PHQ-9 went from a 14 to a 4.  Continue Lexapro 10 mg daily.  Refill sent to the pharmacy.      Relevant Medications   escitalopram (LEXAPRO) 10 MG tablet   Other Visit Diagnoses     Routine general medical examination at a health care facility    -  Primary   Health maintenance reviewed and updated. She declines flu vaccine today. She is not fasting and would like to hold off on labs.  Follow-up in 2 months.    Relevant Orders   POCT urinalysis dipstick (Completed)        Follow up plan: Return in about 2 months (around 01/21/2022) for weight management .   LABORATORY TESTING:  - Pap smear: up to date  IMMUNIZATIONS:   - Tdap: Tetanus vaccination status reviewed: last tetanus booster within 10 years. - Influenza: Refused - Pneumovax: Not applicable - Prevnar: Not applicable - HPV: Not applicable - Zostavax vaccine: Not applicable  SCREENING: -Mammogram: Up to date  - Colonoscopy: Up to date  - Bone Density: Not applicable  -Hearing Test: Not applicable  -Spirometry: Not applicable   PATIENT COUNSELING:   Advised to take 1 mg of folate supplement per day if capable of pregnancy.   Sexuality: Discussed sexually transmitted diseases, partner selection, use of condoms, avoidance of unintended pregnancy  and contraceptive alternatives.   Advised to avoid cigarette smoking.  I discussed with the patient that most people either abstain from alcohol or drink within safe limits (<=14/week and <=4 drinks/occasion for males, <=7/weeks and <= 3 drinks/occasion for females) and that the risk for alcohol disorders and other health effects rises proportionally with the  number of drinks per week and how often a drinker exceeds daily limits.  Discussed cessation/primary prevention of drug use and availability of treatment for abuse.   Diet: Encouraged to adjust caloric intake to maintain  or achieve ideal body weight, to reduce intake of dietary saturated fat and total fat, to limit sodium intake by avoiding high sodium foods and not adding table salt, and to maintain adequate dietary potassium and calcium preferably from fresh fruits, vegetables, and low-fat dairy products.    stressed the importance of regular exercise  Injury prevention: Discussed safety belts, safety helmets, smoke detector, smoking near bedding or upholstery.   Dental health: Discussed importance  of regular tooth brushing, flossing, and dental visits.    NEXT PREVENTATIVE PHYSICAL DUE IN 1 YEAR. Return in about 2 months (around 01/21/2022) for weight management .

## 2021-11-21 NOTE — Assessment & Plan Note (Addendum)
She is lost 5 pounds since her last visit, congratulated her on this!  She is taking her phentermine 15 mg daily without any side effects.  She has noticed some appetite suppression while taking this medication.  She has an appointment tomorrow with [facility] for an informational session.  Continue nutrition and exercise. Follow-up in 2 months.

## 2021-11-21 NOTE — Assessment & Plan Note (Signed)
Noted to the base of her right pinky finger.  She states that is gotten slightly bigger, however would like to hold off on referral to orthopedics at this time.

## 2021-11-21 NOTE — Assessment & Plan Note (Signed)
Chronic, improving.  She states that the Lexapro is really helping her symptoms.  She denies SI/HI.  Her PHQ-9 went from a 14 to a 4.  Continue Lexapro 10 mg daily.  Refill sent to the pharmacy.

## 2021-11-21 NOTE — Patient Instructions (Signed)
It was great to see you!  Keep taking the lexapro and phentermine. Keep up the great work with your nutrition and exercise!  Let's follow-up in 2 months, sooner if you have concerns.  If a referral was placed today, you will be contacted for an appointment. Please note that routine referrals can sometimes take up to 3-4 weeks to process. Please call our office if you haven't heard anything after this time frame.  Take care,  Vance Peper, NP

## 2021-11-22 ENCOUNTER — Ambulatory Visit: Payer: BC Managed Care – PPO | Admitting: Bariatrics

## 2021-11-22 DIAGNOSIS — Z0289 Encounter for other administrative examinations: Secondary | ICD-10-CM

## 2021-11-23 NOTE — Progress Notes (Signed)
Called and left detailed vm. Sw, cma

## 2021-11-27 ENCOUNTER — Ambulatory Visit (INDEPENDENT_AMBULATORY_CARE_PROVIDER_SITE_OTHER): Payer: BC Managed Care – PPO | Admitting: Bariatrics

## 2021-11-27 ENCOUNTER — Encounter: Payer: Self-pay | Admitting: Bariatrics

## 2021-11-27 VITALS — BP 138/82 | HR 86 | Temp 98.1°F | Ht 63.0 in | Wt 230.0 lb

## 2021-11-27 DIAGNOSIS — E669 Obesity, unspecified: Secondary | ICD-10-CM | POA: Diagnosis not present

## 2021-11-27 DIAGNOSIS — R7309 Other abnormal glucose: Secondary | ICD-10-CM

## 2021-11-27 DIAGNOSIS — E786 Lipoprotein deficiency: Secondary | ICD-10-CM | POA: Insufficient documentation

## 2021-11-27 DIAGNOSIS — Z1331 Encounter for screening for depression: Secondary | ICD-10-CM

## 2021-11-27 DIAGNOSIS — E559 Vitamin D deficiency, unspecified: Secondary | ICD-10-CM

## 2021-11-27 DIAGNOSIS — Z6841 Body Mass Index (BMI) 40.0 and over, adult: Secondary | ICD-10-CM | POA: Insufficient documentation

## 2021-11-27 DIAGNOSIS — E66813 Obesity, class 3: Secondary | ICD-10-CM | POA: Insufficient documentation

## 2021-11-27 DIAGNOSIS — R5383 Other fatigue: Secondary | ICD-10-CM | POA: Diagnosis not present

## 2021-11-27 DIAGNOSIS — Z0000 Encounter for general adult medical examination without abnormal findings: Secondary | ICD-10-CM | POA: Insufficient documentation

## 2021-11-27 DIAGNOSIS — R0602 Shortness of breath: Secondary | ICD-10-CM

## 2021-11-28 ENCOUNTER — Encounter (INDEPENDENT_AMBULATORY_CARE_PROVIDER_SITE_OTHER): Payer: Self-pay | Admitting: Bariatrics

## 2021-11-28 DIAGNOSIS — E7800 Pure hypercholesterolemia, unspecified: Secondary | ICD-10-CM | POA: Insufficient documentation

## 2021-11-28 DIAGNOSIS — R7303 Prediabetes: Secondary | ICD-10-CM | POA: Insufficient documentation

## 2021-11-28 LAB — COMPREHENSIVE METABOLIC PANEL
ALT: 33 IU/L — ABNORMAL HIGH (ref 0–32)
AST: 33 IU/L (ref 0–40)
Albumin/Globulin Ratio: 2 (ref 1.2–2.2)
Albumin: 4.9 g/dL (ref 3.9–4.9)
Alkaline Phosphatase: 108 IU/L (ref 44–121)
BUN/Creatinine Ratio: 7 — ABNORMAL LOW (ref 9–23)
BUN: 5 mg/dL — ABNORMAL LOW (ref 6–24)
Bilirubin Total: 0.5 mg/dL (ref 0.0–1.2)
CO2: 20 mmol/L (ref 20–29)
Calcium: 10 mg/dL (ref 8.7–10.2)
Chloride: 100 mmol/L (ref 96–106)
Creatinine, Ser: 0.72 mg/dL (ref 0.57–1.00)
Globulin, Total: 2.5 g/dL (ref 1.5–4.5)
Glucose: 114 mg/dL — ABNORMAL HIGH (ref 70–99)
Potassium: 4.2 mmol/L (ref 3.5–5.2)
Sodium: 137 mmol/L (ref 134–144)
Total Protein: 7.4 g/dL (ref 6.0–8.5)
eGFR: 105 mL/min/{1.73_m2} (ref >59)

## 2021-11-28 LAB — LIPID PANEL WITH LDL/HDL RATIO
Cholesterol, Total: 239 mg/dL — ABNORMAL HIGH (ref 100–199)
HDL: 47 mg/dL (ref >39)
LDL Chol Calc (NIH): 151 mg/dL — ABNORMAL HIGH (ref 0–99)
LDL/HDL Ratio: 3.2 ratio (ref 0.0–3.2)
Triglycerides: 225 mg/dL — ABNORMAL HIGH (ref 0–149)
VLDL Cholesterol Cal: 41 mg/dL — ABNORMAL HIGH (ref 5–40)

## 2021-11-28 LAB — INSULIN, RANDOM: INSULIN: 23.7 u[IU]/mL (ref 2.6–24.9)

## 2021-11-28 LAB — TSH+T4F+T3FREE
Free T4: 1.04 ng/dL (ref 0.82–1.77)
T3, Free: 3.3 pg/mL (ref 2.0–4.4)
TSH: 2.25 u[IU]/mL (ref 0.450–4.500)

## 2021-11-28 LAB — HEMOGLOBIN A1C
Est. average glucose Bld gHb Est-mCnc: 126 mg/dL
Hgb A1c MFr Bld: 6 % — ABNORMAL HIGH (ref 4.8–5.6)

## 2021-11-28 LAB — VITAMIN D 25 HYDROXY (VIT D DEFICIENCY, FRACTURES): Vit D, 25-Hydroxy: 27.1 ng/mL — ABNORMAL LOW (ref 30.0–100.0)

## 2021-12-10 NOTE — Progress Notes (Unsigned)
Chief Complaint:   OBESITY Kayla Zimmerman (MR# [id number]) is a 46 y.o. female who presents for evaluation and treatment of obesity and related comorbidities. Current BMI is Body mass index is 40.74 kg/m. Kayla Zimmerman has been struggling with her weight for many years and has been unsuccessful in either losing weight, maintaining weight loss, or reaching her healthy weight goal.  Kayla Zimmerman is currently in the action stage of change and ready to dedicate time achieving and maintaining a healthier weight. Kayla Zimmerman is interested in becoming our patient and working on intensive lifestyle modifications including (but not limited to) diet and exercise for weight loss.  Kayla Zimmerman's habits were reviewed today and are as follows: {MWM WT HABITS:23461}.  Depression Screen Kayla Zimmerman's Food and Mood (modified PHQ-9) score was 16.     11/27/2021    9:00 AM  Depression screen PHQ 2/9  Decreased Interest 3  Down, Depressed, Hopeless 3  PHQ - 2 Score 6  Altered sleeping 0  Tired, decreased energy 2  Change in appetite 2  Feeling bad or failure about yourself  3  Trouble concentrating 0  Moving slowly or fidgety/restless 0  Suicidal thoughts 3  PHQ-9 Score 16  Difficult doing work/chores Somewhat difficult   Subjective:   1. Other fatigue Kayla Zimmerman admits to daytime somnolence and admits to waking up still tired. Patient has a history of symptoms of daytime fatigue, morning fatigue, and morning headache. Kayla Zimmerman generally gets 4 or 6 hours of sleep per night, and states that she has nightime awakenings. Snoring is not present. Apneic episodes are not present. Epworth Sleepiness Score is 1.   2. SOB (shortness of breath) on exertion Kayla Zimmerman notes increasing shortness of breath with exercising and seems to be worsening over time with weight gain. She notes getting out of breath sooner with activity than she used to. This has not gotten worse recently. Kayla Zimmerman denies shortness of breath at rest or orthopnea.  3. Elevated  glucose ***  4. Low HDL (under 40) ***  5. Health care maintenance ***  6. Vitamin D deficiency ***  Assessment/Plan:   1. Other fatigue Kayla Zimmerman does feel that her weight is causing her energy to be lower than it should be. Fatigue may be related to obesity, depression or many other causes. Labs will be ordered, and in the meanwhile, Kayla Zimmerman will focus on self care including making healthy food choices, increasing physical activity and focusing on stress reduction.  - EKG 12-Lead - Comprehensive metabolic panel - [id number]  2. SOB (shortness of breath) on exertion Kayla Zimmerman does feel that she gets out of breath more easily that she used to when she exercises. Kayla Zimmerman's shortness of breath appears to be obesity related and exercise induced. She has agreed to work on weight loss and gradually increase exercise to treat her exercise induced shortness of breath. Will continue to monitor closely.  3. Elevated glucose *** - Hemoglobin A1c - Insulin, random  4. Low HDL (under 40) *** - Comprehensive metabolic panel - Lipid Panel With LDL/HDL Ratio  5. Health care maintenance *** - Comprehensive metabolic panel - [id number]  6. Vitamin D deficiency *** - VITAMIN D 25 Hydroxy (Vit-D Deficiency, Fractures)  7. Depression screening Kayla Zimmerman had a positive depression screening. Depression is commonly associated with obesity and often results in emotional eating behaviors. We will monitor this closely and work on CBT to help improve the non-hunger eating patterns. Referral to Psychology may be required if no improvement is seen as she  continues in our clinic.  8. Obesity, Current BMI 40.9 Kayla Zimmerman is currently in the action stage of change and her goal is to continue with weight loss efforts. I recommend Kayla Zimmerman begin the structured treatment plan as follows:  She has agreed to the Category 3 Plan.  Meal planning was discussed.  She will eliminate sugary drinks and will adhere to the  plan.  Exercise goals: Hula hoop 3 times per week and walking.     Behavioral modification strategies: increasing lean protein intake, decreasing simple carbohydrates, increasing vegetables, increasing water intake, decreasing eating out, no skipping meals, meal planning and cooking strategies, keeping healthy foods in the home, and planning for success.  She was informed of the importance of frequent follow-up visits to maximize her success with intensive lifestyle modifications for her multiple health conditions. She was informed we would discuss her lab results at her next visit unless there is a critical issue that needs to be addressed sooner. Kayla Zimmerman agreed to keep her next visit at the agreed upon time to discuss these results.  Objective:   [place] pressure 138/82, pulse 86, temperature 98.1 F (36.7 C), height 5\' 3"  (1.6 m), weight 230 lb (104.3 kg), last menstrual period 11/14/2021, SpO2 96 %. Body mass index is 40.74 kg/m.  EKG: Normal sinus rhythm, rate 85 BPM.  Indirect Calorimeter completed today shows a VO2 of 355 and a REE of 2448.  Her calculated basal metabolic rate is [id number] thus her basal metabolic rate is better than expected.  General: Cooperative, alert, well developed, in no acute distress. HEENT: Conjunctivae and lids unremarkable. Cardiovascular: Regular rhythm.  Lungs: Normal work of breathing. Neurologic: No focal deficits.   Lab Results  Component Value Date   CREATININE 0.72 11/27/2021   BUN 5 (L) 11/27/2021   NA 137 11/27/2021   K 4.2 11/27/2021   CL 100 11/27/2021   CO2 20 11/27/2021   Lab Results  Component Value Date   ALT 33 (H) 11/27/2021   AST 33 11/27/2021   ALKPHOS 108 11/27/2021   BILITOT 0.5 11/27/2021   Lab Results  Component Value Date   HGBA1C 6.0 (H) 11/27/2021   Lab Results  Component Value Date   INSULIN 23.7 11/27/2021   Lab Results  Component Value Date   TSH 2.250 11/27/2021   Lab Results  Component Value Date   CHOL  239 (H) 11/27/2021   HDL 47 11/27/2021   LDLCALC 151 (H) 11/27/2021   TRIG 225 (H) 11/27/2021   CHOLHDL 3.7 04/12/2013   Lab Results  Component Value Date   WBC 7.7 04/12/2013   HGB 13.9 04/12/2013   HCT 40.6 04/12/2013   MCV 86.8 04/12/2013   PLT 356 04/12/2013   No results found for: "IRON", "TIBC", "FERRITIN"  Attestation Statements:   Reviewed by clinician on day of visit: allergies, medications, problem list, medical history, surgical history, family history, social history, and previous encounter notes.   Wilhemena Durie, am acting as Location manager for [employer], DO.  I have reviewed the above documentation for accuracy and completeness, and I agree with the above. - ***

## 2021-12-11 ENCOUNTER — Encounter: Payer: Self-pay | Admitting: Nurse Practitioner

## 2021-12-11 ENCOUNTER — Ambulatory Visit: Payer: BC Managed Care – PPO | Admitting: Nurse Practitioner

## 2021-12-11 VITALS — BP 132/68 | HR 84 | Temp 98.9°F | Ht 63.0 in | Wt 228.0 lb

## 2021-12-11 DIAGNOSIS — E669 Obesity, unspecified: Secondary | ICD-10-CM

## 2021-12-11 DIAGNOSIS — R748 Abnormal levels of other serum enzymes: Secondary | ICD-10-CM

## 2021-12-11 DIAGNOSIS — Z6841 Body Mass Index (BMI) 40.0 and over, adult: Secondary | ICD-10-CM

## 2021-12-11 DIAGNOSIS — R7303 Prediabetes: Secondary | ICD-10-CM

## 2021-12-11 DIAGNOSIS — E559 Vitamin D deficiency, unspecified: Secondary | ICD-10-CM

## 2021-12-11 DIAGNOSIS — E782 Mixed hyperlipidemia: Secondary | ICD-10-CM

## 2021-12-11 MED ORDER — VITAMIN D (ERGOCALCIFEROL) 1.25 MG (50000 UNIT) PO CAPS: 50000.0000 [IU] | ORAL_CAPSULE | ORAL | 0 refills | Status: DC

## 2021-12-11 NOTE — Patient Instructions (Signed)
The 10-year ASCVD risk score (Arnett DK, et al., 2019) is: 1.5%   Values used to calculate the score:     Age: 46 years     Sex: Female     Is Non-Hispanic African American: No     Diabetic: No     Tobacco smoker: No     Systolic [place] Pressure: 673 mmHg     Is BP treated: No     HDL Cholesterol: 47 mg/dL     Total Cholesterol: 239 mg/dL

## 2021-12-17 NOTE — Progress Notes (Unsigned)
Chief Complaint:   OBESITY Kayla Zimmerman is here to discuss her progress with her obesity treatment plan along with follow-up of her obesity related diagnoses. Lynsee is on the Category 3 Plan and states she is following her eating plan approximately 90% of the time. Mario states she is hulah hoop/walking 20-60 minutes 3 times per week.  Today's visit was #: 2 Starting weight: 230 lbs Starting date: 11/27/2021 Today's weight: 228 lbs Today's date: 12/11/2021 Total lbs lost to date: 2 lbs Total lbs lost since last in-office visit: 2  Interim History: Kit has done well with weight loss since her last visit. Denies polyphagia. Struggles with water intake. She is going on a cruise Nov 19-28. Taking Phentermine 15 mg prescribed by PCP.  Subjective:   1. Mixed hyperlipidemia Labs discussed during visit today. Jaylenne has never been on medications. Family history: paternal grandfather.  2. Vitamin D deficiency Labs discussed during visit today. Orel has taken Vit D 50k IU and over the counter in the past. She is not currently taking any Vit D.  3. Elevated liver enzymes Labs discussed during visit today. Rilynne denies any ABD pain.  4. Prediabetes Labs discussed during visit today. Ashara has never been on medications. Family history: parternal grandfather with DMT2.  Assessment/Plan:   1. Mixed hyperlipidemia ASCVD reviewed with patient. Cardiovascular risk and specific lipid/LDL goals reviewed.  We discussed several lifestyle modifications today and [name] will continue to work on diet, exercise and weight loss efforts. Orders and follow up as documented in patient record.   Counseling Intensive lifestyle modifications are the first line treatment for this issue. Dietary changes: Increase soluble fiber. Decrease simple carbohydrates. Exercise changes: Moderate to vigorous-intensity aerobic activity 150 minutes per week if tolerated. Lipid-lowering medications: see documented in medical  record.   2. Vitamin D deficiency Start Vit D 50,000 IU once a week for 1 month with 0 refills. Side effects discussed.   -Start Vitamin D, Ergocalciferol, (DRISDOL) 1.25 MG (50000 UNIT) CAPS capsule; Take 1 capsule (50,000 Units total) by mouth every 7 (seven) days.  Dispense: 5 capsule; Refill: 0  3. Elevated liver enzymes Will continue to monitor.  4. Prediabetes Handouts given: on Pre DM amd Metformin. [name] will continue to work on weight loss, exercise, and decreasing simple carbohydrates to help decrease the risk of diabetes.    5. Obesity, Current BMI 40.4 [name] is currently in the action stage of change. As such, her goal is to continue with weight loss efforts. She has agreed to the Category 3 Plan.   Exercise goals: As is.  Byrdie's goal is to increase water intake. Options discussed, Metformin, Saxenda, [id number] or new weight loss medication.  Behavioral modification strategies: increasing lean protein intake, increasing vegetables, and increasing water intake.  [name] has agreed to follow-up with our clinic in 4 weeks. She was informed of the importance of frequent follow-up visits to maximize her success with intensive lifestyle modifications for her multiple health conditions.   Objective:   Whitehouse pressure 132/68, pulse 84, temperature 98.9 F (37.2 C), temperature source Oral, height 5\' 3"  (1.6 m), weight 228 lb (103.4 kg), last menstrual period 11/14/2021, SpO2 97 %. Body mass index is 40.39 kg/m.  General: Cooperative, alert, well developed, in no acute distress. HEENT: Conjunctivae and lids unremarkable. Cardiovascular: Regular rhythm.  Lungs: Normal work of breathing. Neurologic: No focal deficits.   Lab Results  Component Value Date   CREATININE 0.72 11/27/2021   BUN 5 (L) 11/27/2021  NA 137 11/27/2021   K 4.2 11/27/2021   CL 100 11/27/2021   CO2 20 11/27/2021   Lab Results  Component Value Date   ALT 33 (H) 11/27/2021   AST 33 11/27/2021   ALKPHOS  108 11/27/2021   BILITOT 0.5 11/27/2021   Lab Results  Component Value Date   HGBA1C 6.0 (H) 11/27/2021   Lab Results  Component Value Date   INSULIN 23.7 11/27/2021   Lab Results  Component Value Date   TSH 2.250 11/27/2021   Lab Results  Component Value Date   CHOL 239 (H) 11/27/2021   HDL 47 11/27/2021   LDLCALC 151 (H) 11/27/2021   TRIG 225 (H) 11/27/2021   CHOLHDL 3.7 04/12/2013   Lab Results  Component Value Date   VD25OH 27.1 (L) 11/27/2021   Lab Results  Component Value Date   WBC 7.7 04/12/2013   HGB 13.9 04/12/2013   HCT 40.6 04/12/2013   MCV 86.8 04/12/2013   PLT 356 04/12/2013   No results found for: "IRON", "TIBC", "FERRITIN"  Attestation Statements:   Reviewed by clinician on day of visit: allergies, medications, problem list, medical history, surgical history, family history, social history, and previous encounter notes.  I, Brendell Tyus, RMA, am acting as transcriptionist for Irene Limbo, FNP.  I have reviewed the above documentation for accuracy and completeness, and I agree with the above. -  ***

## 2022-01-08 ENCOUNTER — Ambulatory Visit: Payer: BC Managed Care – PPO | Admitting: Nurse Practitioner

## 2022-01-08 ENCOUNTER — Encounter: Payer: Self-pay | Admitting: Nurse Practitioner

## 2022-01-08 VITALS — BP 131/50 | HR 76 | Temp 98.7°F | Ht 63.0 in | Wt 228.0 lb

## 2022-01-08 DIAGNOSIS — Z6841 Body Mass Index (BMI) 40.0 and over, adult: Secondary | ICD-10-CM | POA: Diagnosis not present

## 2022-01-08 DIAGNOSIS — E669 Obesity, unspecified: Secondary | ICD-10-CM | POA: Diagnosis not present

## 2022-01-08 DIAGNOSIS — E559 Vitamin D deficiency, unspecified: Secondary | ICD-10-CM | POA: Diagnosis not present

## 2022-01-08 DIAGNOSIS — R7303 Prediabetes: Secondary | ICD-10-CM | POA: Diagnosis not present

## 2022-01-08 MED ORDER — VITAMIN D (ERGOCALCIFEROL) 1.25 MG (50000 UNIT) PO CAPS: 50000.0000 [IU] | ORAL_CAPSULE | ORAL | 0 refills | Status: DC

## 2022-01-15 ENCOUNTER — Ambulatory Visit: Payer: BC Managed Care – PPO | Admitting: Nurse Practitioner

## 2022-01-15 ENCOUNTER — Encounter: Payer: Self-pay | Admitting: Nurse Practitioner

## 2022-01-15 VITALS — BP 110/74 | HR 68 | Temp 98.5°F | Ht 63.0 in | Wt 230.0 lb

## 2022-01-15 DIAGNOSIS — H938X1 Other specified disorders of right ear: Secondary | ICD-10-CM | POA: Diagnosis not present

## 2022-01-15 NOTE — Progress Notes (Signed)
Established Patient Office Visit  Subjective   Patient ID: Kayla Zimmerman, female    DOB: [birth year]  Age: 46 y.o. MRN: [id number]  Chief Complaint  Patient presents with   Follow-up    2 month follow up for weight loss, having fullness in right ear notice this morning      HPI  Kayla Zimmerman is here to follow-up on weight management and also started having fullness in her right ear this morning.   She has started seeing a provider at the [facility]. She stopped taking the phentermine in November because she started having heart palpitations. She has been focusing on her nutrition and exercise. She discussed the possibility of starting zepbound in the future.   She also noticed fullness in her right ear while in the shower this morning. She is wondering if there is any wax buildup in her ear. She denies fever, sore throat, congestion, and other respiratory symptoms.     ROS See pertinent positives and negatives per HPI.    Objective:     BP 110/74   Pulse 68   Temp 98.5 F (36.9 C) (Oral)   Ht 5\' 3"  (1.6 m)   Wt 230 lb (104.3 kg)   LMP 01/10/2022   SpO2 97%   BMI 40.74 kg/m  BP Readings from Last 3 Encounters:  01/15/22 110/74  01/08/22 (!) 131/50  12/11/21 132/68   Wt Readings from Last 3 Encounters:  01/15/22 230 lb (104.3 kg)  01/08/22 228 lb (103.4 kg)  12/11/21 228 lb (103.4 kg)      Physical Exam Vitals and nursing note reviewed.  Constitutional:      General: She is not in acute distress.    Appearance: Normal appearance.  HENT:     Head: Normocephalic.     Right Ear: Tympanic membrane, ear canal and external ear normal.     Left Ear: Tympanic membrane, ear canal and external ear normal.  Eyes:     Conjunctiva/sclera: Conjunctivae normal.  Cardiovascular:     Rate and Rhythm: Normal rate and regular rhythm.     Pulses: Normal pulses.     Heart sounds: Normal heart sounds.  Pulmonary:     Effort: Pulmonary effort is  normal.     Breath sounds: Normal breath sounds.  Musculoskeletal:     Cervical back: Normal range of motion.  Skin:    General: Skin is warm.  Neurological:     General: No focal deficit present.     Mental Status: She is alert and oriented to person, place, and time.  Psychiatric:        Mood and Affect: Mood normal.        Behavior: Behavior normal.        Thought Content: Thought content normal.        Judgment: Judgment normal.     The 10-year ASCVD risk score (Arnett DK, et al., 2019) is: 1%    Assessment & Plan:   Problem List Items Addressed This Visit       Other   Morbid obesity (HCC)    BMI today is 40.7. She is starting to follow with [facility]. Will continue collaboration and recommendations from specialist.       Other Visit Diagnoses     Pressure sensation in right ear    -  Primary   No redness, effusion, or cerumen seen. She can start claritin/flonase daily and try  hydrogen peroxide as needed. F/U if not improving.       Return in about 6 months (around 07/17/2022) for Depression, weight management .    Gerre Scull, NP

## 2022-01-15 NOTE — Assessment & Plan Note (Signed)
BMI today is 40.7. She is starting to follow with [facility]. Will continue collaboration and recommendations from specialist.

## 2022-01-15 NOTE — Patient Instructions (Signed)
It was great to see you!  Keep up the great work!  You can take a claritin or zyrtec to help with your ear. If that doesn't work,try a little peroxide. If not, send me a message.   Let's follow-up in 6 months, sooner if you have concerns.  If a referral was placed today, you will be contacted for an appointment. Please note that routine referrals can sometimes take up to 3-4 weeks to process. Please call our office if you haven't heard anything after this time frame.  Take care,  Rodman Pickle, NP

## 2022-01-24 NOTE — Progress Notes (Signed)
Chief Complaint:   OBESITY Kayla Zimmerman is here to discuss her progress with her obesity treatment plan along with follow-up of her obesity related diagnoses. Kayla Zimmerman is on the Category 3 Plan and states she is following her eating plan approximately 80% of the time. Kayla Zimmerman states she is walking 60 minutes 7 times per week.  Today's visit was #: 3 Starting weight: 230 lbs Starting date: 11/27/2021 Today's weight: 228 lbs Today's date: 01/08/2022 Total lbs lost to date: 2 lbs Total lbs lost since last in-office visit: 0  Interim History: Kayla Zimmerman is excited that she maintained her weight since her last visit especially since she went on a cruise. She is going on another cruise the 16th-23rd. She stopped Phentermine due to side effects of palpitations. Drinking more water.   Subjective:   1. Prediabetes Kayla Zimmerman's last A1c was 6.0. Never been on medication.  2. Vitamin D deficiency Kayla Zimmerman is currently taking prescription Vit D 50,000 IU once a week. Denies any nausea, vomiting or muscle weakness.  Assessment/Plan:   1. Prediabetes Discussed Metformin. She would like to hold off until next visit to discuss options.  Kayla Zimmerman will continue to work on weight loss, exercise, and decreasing simple carbohydrates to help decrease the risk of diabetes.    2. Vitamin D deficiency We will refill Vit D 50K IU once a week for 1 month with 0 refills. Side effects discussed.   Low Vitamin D level contributes to fatigue and are associated with obesity, breast, and colon cancer. She agrees to continue to take prescription Vitamin D @50 ,000 IU every week and will follow-up for routine testing of Vitamin D, at least 2-3 times per year to avoid over-replacement.   -Refill Vitamin D, Ergocalciferol, (DRISDOL) 1.25 MG (50000 UNIT) CAPS capsule; Take 1 capsule (50,000 Units total) by mouth every 7 (seven) days.  Dispense: 5 capsule; Refill: 0  3. Obesity, Current BMI 40.4 Kayla Zimmerman is currently in the action stage of  change. As such, her goal is to continue with weight loss efforts. She has agreed to the Category 3 Plan.   Kayla Zimmerman will consider Zepbound once available.   Exercise goals: All adults should avoid inactivity. Some physical activity is better than none, and adults who participate in any amount of physical activity gain some health benefits.  Behavioral modification strategies: increasing lean protein intake, increasing water intake, and holiday eating strategies .  Kayla Zimmerman has agreed to follow-up with our clinic in 3 weeks. She was informed of the importance of frequent follow-up visits to maximize her success with intensive lifestyle modifications for her multiple health conditions.   Objective:   Platts pressure (!) 131/50, pulse 76, temperature 98.7 F (37.1 C), temperature source Oral, height 5\' 3"  (1.6 m), weight 228 lb (103.4 kg), SpO2 97 %. Body mass index is 40.39 kg/m.  General: Cooperative, alert, well developed, in no acute distress. HEENT: Conjunctivae and lids unremarkable. Cardiovascular: Regular rhythm.  Lungs: Normal work of breathing. Neurologic: No focal deficits.   Lab Results  Component Value Date   CREATININE 0.72 11/27/2021   BUN 5 (L) 11/27/2021   NA 137 11/27/2021   K 4.2 11/27/2021   CL 100 11/27/2021   CO2 20 11/27/2021   Lab Results  Component Value Date   ALT 33 (H) 11/27/2021   AST 33 11/27/2021   ALKPHOS 108 11/27/2021   BILITOT 0.5 11/27/2021   Lab Results  Component Value Date   HGBA1C 6.0 (H) 11/27/2021   Lab Results  Component Value Date   INSULIN 23.7 11/27/2021   Lab Results  Component Value Date   TSH 2.250 11/27/2021   Lab Results  Component Value Date   CHOL 239 (H) 11/27/2021   HDL 47 11/27/2021   LDLCALC 151 (H) 11/27/2021   TRIG 225 (H) 11/27/2021   CHOLHDL 3.7 04/12/2013   Lab Results  Component Value Date   VD25OH 27.1 (L) 11/27/2021   Lab Results  Component Value Date   WBC 7.7 04/12/2013   HGB 13.9 04/12/2013    HCT 40.6 04/12/2013   MCV 86.8 04/12/2013   PLT 356 04/12/2013   No results found for: "IRON", "TIBC", "FERRITIN"  Attestation Statements:   Reviewed by clinician on day of visit: allergies, medications, problem list, medical history, surgical history, family history, social history, and previous encounter notes.  I, Brendell Tyus, RMA, am acting as transcriptionist for Everardo Pacific, FNP.  I have reviewed the above documentation for accuracy and completeness, and I agree with the above. Everardo Pacific, FNP

## 2022-01-31 ENCOUNTER — Ambulatory Visit: Payer: BC Managed Care – PPO | Admitting: Nurse Practitioner

## 2022-02-19 ENCOUNTER — Ambulatory Visit: Payer: BC Managed Care – PPO | Admitting: Nurse Practitioner

## 2022-02-19 ENCOUNTER — Encounter: Payer: Self-pay | Admitting: Nurse Practitioner

## 2022-02-19 VITALS — BP 129/76 | HR 78 | Temp 98.5°F | Ht 63.0 in | Wt 225.0 lb

## 2022-02-19 DIAGNOSIS — E559 Vitamin D deficiency, unspecified: Secondary | ICD-10-CM | POA: Diagnosis not present

## 2022-02-19 DIAGNOSIS — I341 Nonrheumatic mitral (valve) prolapse: Secondary | ICD-10-CM | POA: Diagnosis not present

## 2022-02-19 DIAGNOSIS — E669 Obesity, unspecified: Secondary | ICD-10-CM | POA: Diagnosis not present

## 2022-02-19 DIAGNOSIS — Z6841 Body Mass Index (BMI) 40.0 and over, adult: Secondary | ICD-10-CM | POA: Diagnosis not present

## 2022-02-19 MED ORDER — ZEPBOUND 2.5 MG/0.5ML ~~LOC~~ SOAJ: 2.5000 mg | SUBCUTANEOUS | 0 refills | Status: DC

## 2022-02-19 MED ORDER — VITAMIN D (ERGOCALCIFEROL) 1.25 MG (50000 UNIT) PO CAPS: 50000.0000 [IU] | ORAL_CAPSULE | ORAL | 0 refills | Status: DC

## 2022-02-19 NOTE — Patient Instructions (Signed)
What is a GLP-1 Glucagon like peptide-1 (GLP-1) agonists represent a class of medications used to treat type 2 diabetes mellitus and obesity.  GLP-1 medications mimic the action of a hormone called glucagon like peptide 1.  When Kapfer sugar levels start to rise/increase these drugs stimulate the body to produce more insulin.  When that happens, the extra insulin helps to lower the Emminger sugar levels in the body.  This in returns helps with decreasing cravings.  These medications also slow the movement of food from the stomach into the small intestine.  This in return helps one to full faster and longer.   Diabetic medications: Approved for treatment of diabetes mellitus but does not have full approval for weight loss use Victoza (liraglutide) Ozempic (semaglutide) Mounjaro Trulicity Rybelsus  Weight loss medications: Approved for long-term weight loss use.        Saxenda (liraglutide) Wegovy (semaglutide) Zepbound Contraindications:  Pancreatitis (active gallstones) Medullary thyroid cancer High triglycerides (>500)-will need labs prior to starting Multiple Endocrine Neoplasia syndrome type 2 (MEN 2) Trying to get pregnant Breastfeeding Use with caution with taking insulin or sulfonylureas (will need to monitor [place] sugars for hypoglycemia) Side effects (most common): Most common side effects are nausea, gas, bloating and constipation.  Other possible side effects are headaches, belching, diarrhea, tiredness (fatigue), vomiting, upset stomach, dizziness, heartburn and stomach (abdominal pain).  If you think that you are becoming dehydrated, please inform our office or your primary family provider.  Stop immediately and go to ER if you have any symptoms of a serious allergic reaction including swelling of your face, lips, tongue or throat; problems breathing or swallowing; severe rash or itching; fainting or feeling dizzy; or very rapid heart rate.                                                                                           

## 2022-02-19 NOTE — Progress Notes (Signed)
Chief Complaint:   OBESITY Kayla Zimmerman is here to discuss her progress with her obesity treatment plan along with follow-up of her obesity related diagnoses. Kayla Zimmerman is on the Category 3 Plan and states she is following her eating plan approximately 89-90% of the time. Kayla Zimmerman states she is hula hoop, dancing and walking for 30 minutes 5 times per week.  Today's visit was #: 4 Starting weight: 230 lbs Starting date: 11/27/2021 Today's weight: 225 lbs Today's date: 02/19/2022 Total lbs lost to date: 5 lbs Total lbs lost since last in-office visit: lost 3  Interim History: Ms. Job Founds has done well with weight loss since her last visit.  She celebrated Christmas and went on a cruise.  She is struggling with polyphagia and cravings.  She is requesting to discuss treatment options.   Subjective:    1. MVP (mitral valve prolapse) Was on medications in the past but is unsure what she was taking.  She reports she took something greater than 20 years ago during pregnancy.  She has seen cardiology in the past.  She has had a total of 4 syncopal episodes since she was a teenager.  Her most recent episode was December 26.  She has a family history of her mother and maternal grandfather with heart disease.  2. Vitamin D deficiency She is taking vitamin D 50,000 IU 1 pill weekly.  Denies side effects.  Denies nausea vomiting or muscle weakness.  Last vitamin D was 27.1.     Assessment/Plan:    1. MVP (mitral valve prolapse) - Ambulatory referral to Cardiology  2. Vitamin D deficiency - Vitamin D, Ergocalciferol, (DRISDOL) 1.25 MG (50000 UNIT) CAPS capsule; Take 1 capsule (50,000 Units total) by mouth every 7 (seven) days.  Dispense: 5 capsule; Refill: 0  Low Vitamin D level contributes to fatigue and are associated with obesity, breast, and colon cancer. She agrees to continue to take prescription Vitamin D @50 ,000 IU every week and will follow-up for routine testing of Vitamin D, at least 2-3 times  per year to avoid over-replacement.   3. Obesity, Current BMI 40.0 - Ambulatory referral to Cardiology - Start tirzepatide [facility]) 2.5 MG/0.5ML Pen; Inject 2.5 mg into the skin once a week.  Dispense: 2 mL; Refill: 0.  Side effects discussed.  Patient is not to get pregnant while taking weight loss medications.  Contraindications: (Patient denies) Pancreatitis (active gallstones) Medullary thyroid cancer High triglycerides (>500)-will need labs prior to starting Multiple Endocrine Neoplasia syndrome type 2 (MEN 2) Trying to get pregnant Breastfeeding Use with caution with taking insulin or sulfonylureas (will need to monitor [place] sugars for hypoglycemia)   Avila is currently in the action stage of change. As such, her goal is to continue with weight loss efforts. She has agreed to the Category 3 Plan.    Behavioral modification strategies: increasing lean protein intake, increasing vegetables, and increasing water intake.  Dorreen has agreed to follow-up with our clinic in 3 weeks. She was informed of the importance of frequent follow-up visits to maximize her success with intensive lifestyle modifications for her multiple health conditions.   Objective:   Heidrich pressure 129/76, pulse 78, temperature 98.5 F (36.9 C), height 5\' 3"  (1.6 m), weight 225 lb (102.1 kg), SpO2 95 %. Body mass index is 39.86 kg/m.  General: Cooperative, alert, well developed, in no acute distress. HEENT: Conjunctivae and lids unremarkable. Cardiovascular: Regular rhythm.  Lungs: Normal work of breathing. Neurologic: No focal deficits.   Lab Results  Component  Value Date   CREATININE 0.72 11/27/2021   BUN 5 (L) 11/27/2021   NA 137 11/27/2021   K 4.2 11/27/2021   CL 100 11/27/2021   CO2 20 11/27/2021   Lab Results  Component Value Date   ALT 33 (H) 11/27/2021   AST 33 11/27/2021   ALKPHOS 108 11/27/2021   BILITOT 0.5 11/27/2021   Lab Results  Component Value Date   HGBA1C 6.0 (H)  11/27/2021   Lab Results  Component Value Date   INSULIN 23.7 11/27/2021   Lab Results  Component Value Date   TSH 2.250 11/27/2021   Lab Results  Component Value Date   CHOL 239 (H) 11/27/2021   HDL 47 11/27/2021   LDLCALC 151 (H) 11/27/2021   TRIG 225 (H) 11/27/2021   CHOLHDL 3.7 04/12/2013   Lab Results  Component Value Date   VD25OH 27.1 (L) 11/27/2021   Lab Results  Component Value Date   WBC 7.7 04/12/2013   HGB 13.9 04/12/2013   HCT 40.6 04/12/2013   MCV 86.8 04/12/2013   PLT 356 04/12/2013   No results found for: "IRON", "TIBC", "FERRITIN"  Obesity Behavioral Intervention:   Approximately 15 minutes were spent on the discussion below.  ASK: We discussed the diagnosis of obesity with Arbie Cookey today and Marjon agreed to give [place] permission to discuss obesity behavioral modification therapy today.  ASSESS: Damira has the diagnosis of obesity and her BMI today is 40.0. Kazoua is in the action stage of change.   ADVISE: Bernadetta was educated on the multiple health risks of obesity as well as the benefit of weight loss to improve her health. She was advised of the need for long term treatment and the importance of lifestyle modifications to improve her current health and to decrease her risk of future health problems.  AGREE: Multiple dietary modification options and treatment options were discussed and Zionna agreed to follow the recommendations documented in the above note.  ARRANGE: Zuha was educated on the importance of frequent visits to treat obesity as outlined per CMS and USPSTF guidelines and agreed to schedule her next follow up appointment today.  Attestation Statements:   Reviewed by clinician on day of visit: allergies, medications, problem list, medical history, surgical history, family history, social history, and previous encounter notes.  I have reviewed the above documentation for accuracy and completeness, and I agree with the above. Everardo Pacific, FNP

## 2022-02-20 ENCOUNTER — Telehealth (INDEPENDENT_AMBULATORY_CARE_PROVIDER_SITE_OTHER): Payer: Self-pay | Admitting: Nurse Practitioner

## 2022-02-20 ENCOUNTER — Encounter: Payer: Self-pay | Admitting: Nurse Practitioner

## 2022-02-20 DIAGNOSIS — M6740 Ganglion, unspecified site: Secondary | ICD-10-CM

## 2022-02-20 NOTE — Telephone Encounter (Signed)
Kayla Zimmerman (Key: [id number])  This request has been approved.  Please note any additional information provided by [employer] at the bottom of your screen.  Your request has been approved [id number];Review Type:Prior Auth;Coverage Start Date:01/21/2022;Coverage End Date:10/18/2022;

## 2022-02-22 NOTE — Telephone Encounter (Signed)
Approved on January 17 CaseId:[id number];Status:Approved;Review Type:Prior Auth;Coverage Start Date:01/21/2022;Coverage End Date:10/18/2022; Authorization Expiration Date: 10/17/2022

## 2022-03-01 NOTE — Progress Notes (Unsigned)
Cardiology Office Note:   Date:  03/04/2022  NAME:  Kayla Zimmerman    MRN: [id number] DOB:  05/23/1975   PCP:  Gerre Scull, NP  Cardiologist:  None  Electrophysiologist:  None   Referring MD: Irene Limbo, *   Chief Complaint  Patient presents with   Mitral Valve Prolapse    History of Present Illness:   Kayla Zimmerman is a 47 y.o. female with a hx of obesity who is being seen today for the evaluation of mitral valve prolapse at the request of Irene Limbo, *.  She reports she was diagnosed with mitral valve prolapse 20 years ago.  Has had no evidence of mitral valve regurgitation or further issues.  She reports she did have a syncopal episode last month.  She had the flu.  She not been eating well.  She reports was also on phentermine for weight loss.  She reports that she woke up to go to the bathroom and passed out.  She reports no chest pain or trouble breathing.  She did not urinate or defecate on herself.  The episode sounds vasovagal.  She reports she tries to exercise but winter months are difficult.  She denies any chest pain or trouble breathing today.  Neglia pressure is stable.  Lipids show elevated cholesterol levels.  Total cholesterol 239, HDL 47, LDL 151, triglycerides 225.  No strong family history of heart disease.  She does not smoke.  No alcohol or drug use was reported.  She works as a Runner, broadcasting/film/video.  She reports possibly being told she had arrhythmia in the past.  No palpitations reported.  CV exam normal.  No clicks.  Her BMI is 40.  She is on weight loss medications.  Hoping to avoid bariatric surgery.  EKG demonstrates sinus rhythm with no acute ischemic changes or evidence of infarction.  This EKG was from October.  Problem List MVP Obesity  Past Medical History: Past Medical History:  Diagnosis Date   Arrhythmia 2002   Mitral valve prolapse   Back pain    Chronic back pain    Depression    Ganglion of joint    Migraines    MVP (mitral valve  prolapse)     Past Surgical History: Past Surgical History:  Procedure Laterality Date   WISDOM TOOTH EXTRACTION      Current Medications: Current Meds  Medication Sig   escitalopram (LEXAPRO) 10 MG tablet Take 1 tablet (10 mg total) by mouth daily.   Multiple Vitamin (MULTI-VITAMINS) TABS Take 1 tablet by mouth daily.   tirzepatide (ZEPBOUND) 2.5 MG/0.5ML Pen Inject 2.5 mg into the skin once a week.   Vitamin D, Ergocalciferol, (DRISDOL) 1.25 MG (50000 UNIT) CAPS capsule Take 1 capsule (50,000 Units total) by mouth every 7 (seven) days.     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  Tobacco Use   Smoking status: Never   Smokeless tobacco: Never  Vaping Use   Vaping Use: Never used  Substance and Sexual Activity   Alcohol use: Never   Drug use: Never   Sexual activity: Yes    Partners: Male    Birth  control/protection: Surgical    Comment: husband vasectomy  Other Topics Concern   Not on file  Social History Narrative   Not on file   Social Determinants of Health   Financial Resource Strain: Not on file  Food Insecurity: Not on file  Transportation Needs: Not on file  Physical Activity: Not on file  Stress: Not on file  Social Connections: Not on file     Family History: The patient's family history includes Arrhythmia in her maternal grandfather and mother; Breast cancer in her maternal aunt and paternal aunt; Cancer in her father, mother, and paternal grandfather; Diabetes in her paternal grandfather; Hypertension in her paternal grandfather; Mitral valve prolapse in her maternal grandfather and mother.  ROS:   All other ROS reviewed and negative.  Pertinent positives noted in the HPI.     EKGs/Labs/Other Studies Reviewed:   The following studies were personally reviewed by me today:  EKG:  EKG reviewed from 11/27/2021.  That EKG demonstrates normal sinus rhythm with no acute ischemic changes or evidence of infarction  Recent Labs: 11/27/2021: ALT 33; BUN 5; Creatinine, Ser 0.72; Potassium 4.2; Sodium 137; TSH 2.250   Recent Lipid Panel    Component Value Date/Time   CHOL 239 (H) 11/27/2021 1003   TRIG 225 (H) 11/27/2021 1003   HDL 47 11/27/2021 1003   CHOLHDL 3.7 04/12/2013 0937   VLDL 25 04/12/2013 0937   LDLCALC 151 (H) 11/27/2021 1003    Physical Exam:   VS:  BP (!) 124/52 (BP Location: Left Arm, Patient Position: Sitting, Cuff Size: Large)   Pulse 86   Ht 5\' 3"  (1.6 m)   Wt 226 lb 9.6 oz (102.8 kg)   SpO2 99%   BMI 40.14 kg/m    Wt Readings from Last 3 Encounters:  03/04/22 226 lb 9.6 oz (102.8 kg)  02/19/22 225 lb (102.1 kg)  01/15/22 230 lb (104.3 kg)    General: Well nourished, well developed, in no acute distress Head: Atraumatic, normal size  Eyes: PEERLA, EOMI  Neck: Supple, no JVD Endocrine: No thryomegaly Cardiac: Normal S1, S2; RRR; no murmurs, rubs, or gallops Lungs: Clear to auscultation bilaterally, no wheezing, rhonchi or rales  Abd: Soft, nontender, no hepatomegaly  Ext: No edema, pulses 2+ Musculoskeletal: No deformities, BUE and BLE strength normal and equal Skin: Warm and dry, no rashes   Neuro: Alert and oriented to person, place, time, and situation, CNII-XII grossly intact, no focal deficits  Psych: Normal mood and affect   ASSESSMENT:   Kayla Zimmerman is a 47 y.o. female who presents for the following: 1. Mitral valve prolapse   2. Vasovagal episode     PLAN:   1. Mitral valve prolapse -History of mitral valve prolapse.  Has not had an echo in some time.  Exam normal.  EKG normal.  Will repeat her echocardiogram.  She will need to see [place] yearly.  No murmur to suggest any  significant mitral valve regurgitation.  2. Vasovagal episode -Isolated episode of vasovagal syncope.  This was in the setting of the fluid not eating well.  No further episodes.  She was also on phentermine which is a weight loss drug.  I suspect the combination was not helpful.  Given this isolated event I see no further need for evaluation.  We will obtain an echo given history of mitral valve prolapse but this is likely unrelated.  No need to restrict her driving.  She will see me back in 1 year.  Disposition: Return in about 1 year (around 03/05/2023).  Medication Adjustments/Labs and Tests Ordered: Current medicines are reviewed at length with the patient today.  Concerns regarding medicines are outlined above.  Orders Placed This Encounter  Procedures   ECHOCARDIOGRAM COMPLETE   No orders of the defined types were placed in this encounter.   Patient Instructions  Medication Instructions:  The current medical regimen is effective;  continue present plan and medications.  *If you need a refill on your cardiac medications before your next appointment, please call your pharmacy*   Testing/Procedures: Echocardiogram - Your physician has requested that you have an echocardiogram. Echocardiography is a painless test that uses sound waves to create images of your heart. It provides your doctor with information about the size and shape of your heart and how well your heart's chambers and valves are working. This procedure takes approximately one hour. There are no restrictions for this procedure.     Follow-Up: At [facility], you and your health needs are our priority.  As part of our continuing mission to provide you with exceptional heart care, we have created designated Provider Care Teams.  These Care Teams include your primary Cardiologist (physician) and Advanced Practice Providers (APPs -  Physician Assistants and Nurse Practitioners) who all work together to provide you  with the care you need, when you need it.  We recommend signing up for the patient portal called "MyChart".  Sign up information is provided on this After Visit Summary.  MyChart is used to connect with patients for Virtual Visits (Telemedicine).  Patients are able to view lab/test results, encounter notes, upcoming appointments, etc.  Non-urgent messages can be sent to your provider as well.   To learn more about what you can do with MyChart, go to [email or web address].    Your next appointment:   12 month(s)  Provider:   Sande Rives, PA-C or Almyra Deforest, PA-C       Signed, Addison Naegeli. Audie Box, MD, [place]  [address], [place], [place] [address] [phone]  03/04/2022 2:05 PM

## 2022-03-04 ENCOUNTER — Encounter: Payer: Self-pay | Admitting: Cardiovascular Disease

## 2022-03-04 ENCOUNTER — Ambulatory Visit: Payer: BC Managed Care – PPO | Attending: Cardiovascular Disease | Admitting: Cardiovascular Disease

## 2022-03-04 VITALS — BP 124/52 | HR 86 | Ht 63.0 in | Wt 226.6 lb

## 2022-03-04 DIAGNOSIS — I341 Nonrheumatic mitral (valve) prolapse: Secondary | ICD-10-CM | POA: Diagnosis not present

## 2022-03-04 DIAGNOSIS — R55 Syncope and collapse: Secondary | ICD-10-CM

## 2022-03-04 NOTE — Patient Instructions (Signed)
Medication Instructions:  The current medical regimen is effective;  continue present plan and medications.  *If you need a refill on your cardiac medications before your next appointment, please call your pharmacy*   Testing/Procedures: Echocardiogram - Your physician has requested that you have an echocardiogram. Echocardiography is a painless test that uses sound waves to create images of your heart. It provides your doctor with information about the size and shape of your heart and how well your heart's chambers and valves are working. This procedure takes approximately one hour. There are no restrictions for this procedure.     Follow-Up: At [facility], you and your health needs are our priority.  As part of our continuing mission to provide you with exceptional heart care, we have created designated Provider Care Teams.  These Care Teams include your primary Cardiologist (physician) and Advanced Practice Providers (APPs -  Physician Assistants and Nurse Practitioners) who all work together to provide you with the care you need, when you need it.  We recommend signing up for the patient portal called "MyChart".  Sign up information is provided on this After Visit Summary.  MyChart is used to connect with patients for Virtual Visits (Telemedicine).  Patients are able to view lab/test results, encounter notes, upcoming appointments, etc.  Non-urgent messages can be sent to your provider as well.   To learn more about what you can do with MyChart, go to [email or web address].    Your next appointment:   12 month(s)  Provider:   Sande Rives, PA-C or Almyra Deforest, PA-C

## 2022-03-11 ENCOUNTER — Ambulatory Visit: Payer: BC Managed Care – PPO | Admitting: Orthopaedic Surgery

## 2022-03-12 ENCOUNTER — Encounter: Payer: Self-pay | Admitting: Nurse Practitioner

## 2022-03-12 ENCOUNTER — Ambulatory Visit: Payer: BC Managed Care – PPO | Admitting: Nurse Practitioner

## 2022-03-12 VITALS — BP 115/75 | HR 74 | Temp 98.5°F | Ht 63.0 in | Wt 224.0 lb

## 2022-03-12 DIAGNOSIS — E559 Vitamin D deficiency, unspecified: Secondary | ICD-10-CM

## 2022-03-12 DIAGNOSIS — Z6839 Body mass index (BMI) 39.0-39.9, adult: Secondary | ICD-10-CM

## 2022-03-12 MED ORDER — ZEPBOUND 5 MG/0.5ML ~~LOC~~ SOAJ: 5.0000 mg | SUBCUTANEOUS | 0 refills | Status: DC

## 2022-03-12 MED ORDER — VITAMIN D (ERGOCALCIFEROL) 1.25 MG (50000 UNIT) PO CAPS: 50000.0000 [IU] | ORAL_CAPSULE | ORAL | 0 refills | Status: DC

## 2022-03-12 NOTE — Progress Notes (Signed)
Office: [phone]  /  Fax: [phone]  WEIGHT SUMMARY AND BIOMETRICS  Medical Weight Loss Height: 5\' 3"  (1.6 m) Weight: 224 lb (101.6 kg) Temp: 98.5 F (36.9 C) Pulse Rate: 74 BP: 115/75 SpO2: 98 % Today's Visit #: 5 Weight at Last VIsit: 225lb Weight Lost Since Last Visit: -1  Body Fat %: 44.9 % Fat Mass (lbs): 100.8 lbs Muscle Mass (lbs): 117.4 lbs Total Body Water (lbs): 88.2 lbs Visceral Fat Rating : 13 Starting Date: 11/27/21 Starting Weight: 230lb Total Weight Loss (lbs): 6 lb (2.722 kg)    HPI  Chief Complaint: OBESITY  Kayla Zimmerman is here to discuss her progress with her obesity treatment plan. She is on the the Category 3 Plan and states she is following her eating plan approximately 80 % of the time. She states she is exercising 0 minutes 0 times per week.   Interval History:  Since last office visit she has lost 1 lb.  She started Zepbound 2.5mg  3 weeks ago.  Denies side effects. Not skipping meals and is meeting calories and protein goals.  She is drinking water daily.     Pharmacotherapy: She is currently taking Zepbound 2.5mg .  Will increase to Zepbound 5mg .  Side effects discussed.    Past medications:  Has tried Phentermine (stopped 12/19/21) and HCG in the past.  Stopped Phentermine due to side effects of tachycardia.   PHYSICAL EXAM:  Parke pressure 115/75, pulse 74, temperature 98.5 F (36.9 C), height 5\' 3"  (1.6 m), weight 224 lb (101.6 kg), last menstrual period 03/09/2022, SpO2 98 %. Body mass index is 39.68 kg/m.  General: She is overweight, cooperative, alert, well developed, and in no acute distress. PSYCH: Has normal mood, affect and thought process.   Extremities: No edema.  Neurologic: No gross sensory or motor deficits. No tremors or fasciculations noted.    DIAGNOSTIC DATA REVIEWED:  BMET    Component Value Date/Time   NA 137 11/27/2021 1003   K 4.2 11/27/2021 1003   CL 100 11/27/2021 1003   CO2 20 11/27/2021 1003    GLUCOSE 114 (H) 11/27/2021 1003   GLUCOSE 102 (H) 04/12/2013 0937   BUN 5 (L) 11/27/2021 1003   CREATININE 0.72 11/27/2021 1003   CREATININE 0.60 04/12/2013 0937   CALCIUM 10.0 11/27/2021 1003   GFRNONAA >89 04/12/2013 0937   GFRAA >89 04/12/2013 0937   Lab Results  Component Value Date   HGBA1C 6.0 (H) 11/27/2021   Lab Results  Component Value Date   INSULIN 23.7 11/27/2021   Lab Results  Component Value Date   TSH 2.250 11/27/2021   CBC    Component Value Date/Time   WBC 7.7 04/12/2013 0937   RBC 4.68 04/12/2013 0937   HGB 13.9 04/12/2013 0937   HCT 40.6 04/12/2013 0937   PLT 356 04/12/2013 0937   MCV 86.8 04/12/2013 0937   MCH 29.7 04/12/2013 0937   MCHC 34.2 04/12/2013 0937   RDW 14.1 04/12/2013 0937   Iron Studies No results found for: "IRON", "TIBC", "FERRITIN", "IRONPCTSAT" Lipid Panel     Component Value Date/Time   CHOL 239 (H) 11/27/2021 1003   TRIG 225 (H) 11/27/2021 1003   HDL 47 11/27/2021 1003   CHOLHDL 3.7 04/12/2013 0937   VLDL 25 04/12/2013 0937   LDLCALC 151 (H) 11/27/2021 1003   Hepatic Function Panel     Component Value Date/Time   PROT 7.4 11/27/2021 1003   ALBUMIN 4.9 11/27/2021 1003   AST 33 11/27/2021 1003  ALT 33 (H) 11/27/2021 1003   ALKPHOS 108 11/27/2021 1003   BILITOT 0.5 11/27/2021 1003      Component Value Date/Time   TSH 2.250 11/27/2021 1003   Nutritional Lab Results  Component Value Date   VD25OH 27.1 (L) 11/27/2021     ASSESSMENT AND PLAN  TREATMENT PLAN FOR OBESITY:  Recommended Dietary Goals  Kayla Zimmerman is currently in the action stage of change. As such, her goal is to continue weight management plan. She has agreed to the Category 3 Plan.  Behavioral Intervention  We discussed the following Behavioral Modification Strategies today: increasing lean protein intake, decreasing simple carbohydrates , increasing vegetables, avoid skipping meals, and increase water intake.  Additional resources provided  today: NA  Recommended Physical Activity Goals  Kayla Zimmerman has been advised to work up to 150 minutes of moderate intensity aerobic activity a week and strengthening exercises 2-3 times per week for cardiovascular health, weight loss maintenance and preservation of muscle mass.   She has agreed to increase physical activity in their day and reduce sedentary time (increase NEAT).    Pharmacotherapy We discussed various medication options to help Kayla Zimmerman with her weight loss efforts and we both agreed to increase Zepbound 5mg .    ASSOCIATED CONDITIONS ADDRESSED TODAY  Vitamin D deficiency Taking Vit D 50,000 IU weekly.  Denies side effects.  Denies nausea, vomiting or muscle weakness.   -     Vitamin D (Ergocalciferol); Take 1 capsule (50,000 Units total) by mouth every 7 (seven) days.  Dispense: 5 capsule; Refill: 0  Morbid obesity ([place]) -     Increase Zepbound; Inject 5 mg into the skin once a week.  Dispense: 2 mL; Refill: 0  side effects discussed.   BMI 39.0-39.9,adult -     Zepbound; Inject 5 mg into the skin once a week.  Dispense: 2 mL; Refill: 0    She is scheduled for an echo 03/22/22  Will obtain labs in 2-3 months  Return in about 3 weeks (around 04/02/2022).Marland Kitchen She was informed of the importance of frequent follow up visits to maximize her success with intensive lifestyle modifications for her multiple health conditions.   ATTESTASTION STATEMENTS:  Reviewed by clinician on day of visit: allergies, medications, problem list, medical history, surgical history, family history, social history, and previous encounter notes.   Time spent on visit including pre-visit chart review and post-visit care and charting was 30 minutes.    Ailene Rud. Jenifer Struve FNP-C

## 2022-03-22 ENCOUNTER — Other Ambulatory Visit (HOSPITAL_COMMUNITY): Payer: BC Managed Care – PPO

## 2022-04-01 ENCOUNTER — Other Ambulatory Visit: Payer: Self-pay

## 2022-04-01 ENCOUNTER — Ambulatory Visit (INDEPENDENT_AMBULATORY_CARE_PROVIDER_SITE_OTHER): Payer: BC Managed Care – PPO | Admitting: Orthopaedic Surgery

## 2022-04-01 ENCOUNTER — Encounter: Payer: Self-pay | Admitting: Orthopaedic Surgery

## 2022-04-01 DIAGNOSIS — R2232 Localized swelling, mass and lump, left upper limb: Secondary | ICD-10-CM

## 2022-04-01 NOTE — Progress Notes (Signed)
The patient is a very pleasant 47 year old female who comes in for evaluation treatment of the left hand mass that has been slowly growing since she first noticed it around August of last year.  She points to the volar aspect of her hand at the fifth ray and feels like something is there between the fourth and fifth rays.  She said it was easily visible and palpable by her primary care physician back in August and you can move around easily almost like a small pebble.  She said that it had grown in size and it feels like its gotten wedge in there.  She denies any triggering and denies any numbness and tingling but it is something that is grown and has become more painful to her.  On exam she does have pain over the A1 pulley but there is no triggering.  She has pain between the fourth and fifth rays in the palm of her hand.  It is difficult to palpate if there is a mass there because she feels like it is grown in size and is certainly something that is bothering her.  She can make a fist and hand is well-perfused.  At this point we need to obtain an MRI of her left hand to assess for a mass and then can go from there in terms of treatment plan given the fact that this is growing in size and causing pain.  We will see her back once we have the MRI.

## 2022-04-03 ENCOUNTER — Ambulatory Visit: Payer: BC Managed Care – PPO | Admitting: Nurse Practitioner

## 2022-04-03 ENCOUNTER — Encounter: Payer: Self-pay | Admitting: Nurse Practitioner

## 2022-04-03 VITALS — BP 121/75 | HR 71 | Temp 98.5°F | Ht 63.0 in | Wt 217.0 lb

## 2022-04-03 DIAGNOSIS — Z6838 Body mass index (BMI) 38.0-38.9, adult: Secondary | ICD-10-CM | POA: Diagnosis not present

## 2022-04-03 DIAGNOSIS — R110 Nausea: Secondary | ICD-10-CM | POA: Diagnosis not present

## 2022-04-03 MED ORDER — ZEPBOUND 5 MG/0.5ML ~~LOC~~ SOAJ: 5.0000 mg | SUBCUTANEOUS | 0 refills | Status: DC

## 2022-04-03 MED ORDER — ONDANSETRON HCL 4 MG PO TABS: 4.0000 mg | ORAL_TABLET | Freq: Three times a day (TID) | ORAL | 0 refills | Status: DC | PRN

## 2022-04-03 NOTE — Progress Notes (Signed)
Office: [phone]  /  Fax: [phone]  WEIGHT SUMMARY AND BIOMETRICS  Weight Lost Since Last Visit: 7lb  No data recorded  Vitals Temp: 98.5 F (36.9 C) BP: 121/75 Pulse Rate: 71 SpO2: 97 %   Anthropometric Measurements Height: '5\' 3"'$  (1.6 m) Weight: 217 lb (98.4 kg) BMI (Calculated): 38.45 Weight at Last Visit: 224lb Weight Lost Since Last Visit: 7lb Starting Weight: 230lb Total Weight Loss (lbs): 13 lb (5.897 kg)   Body Composition  Body Fat %: 43.3 % Fat Mass (lbs): 94.4 lbs Muscle Mass (lbs): 117.2 lbs Total Body Water (lbs): 85.6 lbs Visceral Fat Rating : 12   Other Clinical Data Today's Visit #: 6 Starting Date: 11/27/21     HPI  Chief Complaint: OBESITY  Kayla Zimmerman is here to discuss her progress with her obesity treatment plan. She is on the the Category 3 Plan and states she is following her eating plan approximately 95 % of the time. She states she is exercising 30 minutes 5 days per week.  Interval History:  Since last office visit she lost 7 pounds.  Pharmacotherapy for weight loss: She is currently taking Zepbound '5mg'$  (x 2 doses)  for medical weight loss.  Reports side effects of nausea 24 hours after taking the injection that lasts for 1-2 days. She reports the first week after increasing the dose is awful due to side effects. After the first week, the side effects do tend to decrease. Denies hunger and cravings when taking Zepbound.  Her highest weight was 243 lbs.    Previous pharmacotherapy for medical weight loss:  Phentermine  She stopped Phentermine due to side effects of tachycardia.   PHYSICAL EXAM:  Kayla Zimmerman pressure 121/75, pulse 71, temperature 98.5 F (36.9 C), height '5\' 3"'$  (1.6 m), weight 217 lb (98.4 kg), last menstrual period 03/09/2022, SpO2 97 %. Body mass index is 38.44 kg/m.  General: She is overweight, cooperative, alert, well developed, and in no acute distress. PSYCH: Has normal mood, affect and thought process.    Extremities: No edema.  Neurologic: No gross sensory or motor deficits. No tremors or fasciculations noted.    DIAGNOSTIC DATA REVIEWED:  BMET    Component Value Date/Time   NA 137 11/27/2021 1003   K 4.2 11/27/2021 1003   CL 100 11/27/2021 1003   CO2 20 11/27/2021 1003   GLUCOSE 114 (H) 11/27/2021 1003   GLUCOSE 102 (H) 04/12/2013 0937   BUN 5 (L) 11/27/2021 1003   CREATININE 0.72 11/27/2021 1003   CREATININE 0.60 04/12/2013 0937   CALCIUM 10.0 11/27/2021 1003   GFRNONAA >89 04/12/2013 0937   GFRAA >89 04/12/2013 0937   Lab Results  Component Value Date   HGBA1C 6.0 (H) 11/27/2021   Lab Results  Component Value Date   INSULIN 23.7 11/27/2021   Lab Results  Component Value Date   TSH 2.250 11/27/2021   CBC    Component Value Date/Time   WBC 7.7 04/12/2013 0937   RBC 4.68 04/12/2013 0937   HGB 13.9 04/12/2013 0937   HCT 40.6 04/12/2013 0937   PLT 356 04/12/2013 0937   MCV 86.8 04/12/2013 0937   MCH 29.7 04/12/2013 0937   MCHC 34.2 04/12/2013 0937   RDW 14.1 04/12/2013 0937   Iron Studies No results found for: "IRON", "TIBC", "FERRITIN", "IRONPCTSAT" Lipid Panel     Component Value Date/Time   CHOL 239 (H) 11/27/2021 1003   TRIG 225 (H) 11/27/2021 1003   HDL 47 11/27/2021 1003   CHOLHDL  3.7 04/12/2013 0937   VLDL 25 04/12/2013 0937   LDLCALC 151 (H) 11/27/2021 1003   Hepatic Function Panel     Component Value Date/Time   PROT 7.4 11/27/2021 1003   ALBUMIN 4.9 11/27/2021 1003   AST 33 11/27/2021 1003   ALT 33 (H) 11/27/2021 1003   ALKPHOS 108 11/27/2021 1003   BILITOT 0.5 11/27/2021 1003      Component Value Date/Time   TSH 2.250 11/27/2021 1003   Nutritional Lab Results  Component Value Date   VD25OH 27.1 (L) 11/27/2021     ASSESSMENT AND PLAN  TREATMENT PLAN FOR OBESITY:  Recommended Dietary Goals  Kayla Zimmerman is currently in the action stage of change. As such, her goal is to continue weight management plan. She has agreed to Cat 3  plan.  Behavioral Intervention  We discussed the following Behavioral Modification Strategies today: increasing lean protein intake, increasing vegetables, increasing water intake, and work on meal planning and easy cooking plans.  Additional resources provided today: NA  Recommended Physical Activity Goals  Kayla Zimmerman has been advised to work up to 150 minutes of moderate intensity aerobic activity a week and strengthening exercises 2-3 times per week for cardiovascular health, weight loss maintenance and preservation of muscle mass.   She has agreed to continue physical activity as is.    Pharmacotherapy We discussed various medication options to help Kayla Zimmerman with her weight loss efforts and we both agreed to continue Zepbound '5mg'$ .  Will not increase dose at this time due to side effects and patient is losing weight adequately.  ASSOCIATED CONDITIONS ADDRESSED TODAY  Action/Plan  Nausea  Start Prilosec for a few days when taking and with increasing Zepbound dose and can use Zofran PRN.    -     Ondansetron HCl; Take 1 tablet (4 mg total) by mouth every 8 (eight) hours as needed for nausea or vomiting.  Dispense: 20 tablet; Refill: 0.  Side effects discussed  Morbid obesity ([place]) -     Zepbound; Inject 5 mg into the skin once a week.  Dispense: 2 mL; Refill: 0  BMI 38.0-38.9,adult -     Zepbound; Inject 5 mg into the skin once a week.  Dispense: 2 mL; Refill: 0        Will check labs in April    Return in about 4 weeks (around 05/01/2022).Marland Kitchen She was informed of the importance of frequent follow up visits to maximize her success with intensive lifestyle modifications for her multiple health conditions.   ATTESTASTION STATEMENTS:  Reviewed by clinician on day of visit: allergies, medications, problem list, medical history, surgical history, family history, social history, and previous encounter notes.    Kayla Zimmerman. [name] FNP-C

## 2022-04-07 ENCOUNTER — Encounter: Payer: Self-pay | Admitting: Nurse Practitioner

## 2022-04-12 ENCOUNTER — Ambulatory Visit (HOSPITAL_COMMUNITY): Payer: BC Managed Care – PPO | Attending: Cardiovascular Disease

## 2022-04-12 DIAGNOSIS — I341 Nonrheumatic mitral (valve) prolapse: Secondary | ICD-10-CM | POA: Insufficient documentation

## 2022-04-12 LAB — ECHOCARDIOGRAM COMPLETE
Area-P 1/2: 5.38 cm2
S' Lateral: 2.4 cm

## 2022-04-25 ENCOUNTER — Ambulatory Visit
Admission: RE | Admit: 2022-04-25 | Discharge: 2022-04-25 | Disposition: A | Discharge status: Home / Self Care | Payer: BC Managed Care – PPO | Source: Ambulatory Visit | Attending: Orthopaedic Surgery | Admitting: Orthopaedic Surgery

## 2022-04-25 DIAGNOSIS — R2232 Localized swelling, mass and lump, left upper limb: Secondary | ICD-10-CM | POA: Diagnosis not present

## 2022-04-25 MED ORDER — GADOPICLENOL 0.5 MMOL/ML IV SOLN
10.0000 mL | Freq: Once | INTRAVENOUS | Status: AC | PRN
  Administered 2022-04-25: 10 mL via INTRAVENOUS

## 2022-04-29 ENCOUNTER — Other Ambulatory Visit: Payer: Self-pay | Admitting: Nurse Practitioner

## 2022-05-01 ENCOUNTER — Encounter: Payer: Self-pay | Admitting: Orthopaedic Surgery

## 2022-05-01 ENCOUNTER — Ambulatory Visit: Payer: BC Managed Care – PPO | Admitting: Orthopaedic Surgery

## 2022-05-01 DIAGNOSIS — R2232 Localized swelling, mass and lump, left upper limb: Secondary | ICD-10-CM | POA: Diagnosis not present

## 2022-05-01 NOTE — Progress Notes (Signed)
The patient is a 47 year old female who comes in today to go over an MRI of her left hand.  She had a palpable mass in the volar aspect of her hand between her fourth and fifth metacarpals.  She says it did wax and wane in size but she felt like it was growing.  When I saw her in the office I cannot palpate the mass and we felt it was appropriate to send her for an MRI of her hand.  She is here for review this today.  On exam I still cannot palpate a mass but she is very sensitive and tender over the fifth metacarpal head.  Certainly she could have some inflammation of the A1 pulley there.  There is no active triggering and again there is no mass or redness and there is no swelling today but she is certainly tender there and not over the fourth metacarpal head or A1 pulley.  She could even have some sensitivity of the sesamoid in that area.  The MRI of her hand was normal in terms of finding no mass at all.  There is some subcortical cystic changes in the lunate that have not changed from a previous MRI and there was otherwise no worrisome findings.  They did place a marker over the area of what was felt to be a mass in the soft tissue was unremarkable.  I did give her reassurance that we did not see any type of mass in her hand.  I do feel that she has some inflammation that is bothering her and is certainly present around her fifth metacarpal head.  This could again be inflammation of the A1 pulley or the sesamoid.  I have recommended trying Voltaren gel in this area several times a day.  My next step would be considering a steroid injection over that area if it worsens.  She will let us know.

## 2022-05-02 ENCOUNTER — Encounter: Payer: Self-pay | Admitting: Nurse Practitioner

## 2022-05-02 ENCOUNTER — Ambulatory Visit: Payer: BC Managed Care – PPO | Admitting: Nurse Practitioner

## 2022-05-02 VITALS — BP 135/80 | HR 64 | Temp 98.4°F | Ht 63.0 in | Wt 213.0 lb

## 2022-05-02 DIAGNOSIS — E559 Vitamin D deficiency, unspecified: Secondary | ICD-10-CM | POA: Diagnosis not present

## 2022-05-02 DIAGNOSIS — Z6837 Body mass index (BMI) 37.0-37.9, adult: Secondary | ICD-10-CM | POA: Diagnosis not present

## 2022-05-02 MED ORDER — VITAMIN D (ERGOCALCIFEROL) 1.25 MG (50000 UNIT) PO CAPS: 50000.0000 [IU] | ORAL_CAPSULE | ORAL | 0 refills | Status: DC

## 2022-05-02 MED ORDER — ZEPBOUND 5 MG/0.5ML ~~LOC~~ SOAJ: 5.0000 mg | SUBCUTANEOUS | 0 refills | Status: DC

## 2022-05-02 NOTE — Progress Notes (Signed)
Office: [phone]  /  Fax: [phone]  WEIGHT SUMMARY AND BIOMETRICS  Weight Lost Since Last Visit: 4lb  No data recorded  Vitals Temp: 98.4 F (36.9 C) BP: 135/80 Pulse Rate: 64 SpO2: 97 %   Anthropometric Measurements Height: 5\' 3"  (1.6 m) Weight: 213 lb (96.6 kg) BMI (Calculated): 37.74 Weight at Last Visit: 217lb Weight Lost Since Last Visit: 4lb Starting Weight: 230lb Total Weight Loss (lbs): 17 lb (7.711 kg)   Body Composition  Body Fat %: 43 % Fat Mass (lbs): 91.8 lbs Muscle Mass (lbs): 115.4 lbs Total Body Water (lbs): 85 lbs Visceral Fat Rating : 11   Other Clinical Data Today's Visit #: 7 Starting Date: 11/27/21     HPI  Chief Complaint: OBESITY  Kayla Zimmerman is here to discuss her progress with her obesity treatment plan. She is on the the Category 3 Plan and states she is following her eating plan approximately 90 % of the time. She states she is exercising 30 minutes 5 days per week.   Interval History:  Since last office visit she has lost 4 pounds.  She has a MRI on her hand since her last visit.  She is aiming to eat more protein. She is struggling with meeting water goals.     Pharmacotherapy for weight loss: She is currently taking Zepbound 5mg  for medical weight loss.  Reports side effects of nausea 24 hours after taking the injection that lasts for 1-2 days. Takes Zofran when she takes her injection and notes it has been beneficial.  Her highest weight was 243 lbs. Goal weight 130 lbs.    Previous pharmacotherapy for medical weight loss:  Phentermine  She stopped Phentermine due to side effects of tachycardia.   Bariatric surgery:  Patient never had bariatric surgery.    Vit D deficiency  She hasn't been taking Vit D over the past month.  Feels fatigue since stopping Vit D and is requesting to restart it.     Lab Results  Component Value Date   VD25OH 27.1 (L) 11/27/2021     PHYSICAL EXAM:  Fare pressure 135/80, pulse 64,  temperature 98.4 F (36.9 C), height 5\' 3"  (1.6 m), weight 213 lb (96.6 kg), SpO2 97 %. Body mass index is 37.73 kg/m.  General: She is overweight, cooperative, alert, well developed, and in no acute distress. PSYCH: Has normal mood, affect and thought process.   Extremities: No edema.  Neurologic: No gross sensory or motor deficits. No tremors or fasciculations noted.    DIAGNOSTIC DATA REVIEWED:  BMET    Component Value Date/Time   NA 137 11/27/2021 1003   K 4.2 11/27/2021 1003   CL 100 11/27/2021 1003   CO2 20 11/27/2021 1003   GLUCOSE 114 (H) 11/27/2021 1003   GLUCOSE 102 (H) 04/12/2013 0937   BUN 5 (L) 11/27/2021 1003   CREATININE 0.72 11/27/2021 1003   CREATININE 0.60 04/12/2013 0937   CALCIUM 10.0 11/27/2021 1003   GFRNONAA >89 04/12/2013 0937   GFRAA >89 04/12/2013 0937   Lab Results  Component Value Date   HGBA1C 6.0 (H) 11/27/2021   Lab Results  Component Value Date   INSULIN 23.7 11/27/2021   Lab Results  Component Value Date   TSH 2.250 11/27/2021   CBC    Component Value Date/Time   WBC 7.7 04/12/2013 0937   RBC 4.68 04/12/2013 0937   HGB 13.9 04/12/2013 0937   HCT 40.6 04/12/2013 0937   PLT 356 04/12/2013 0937  MCV 86.8 04/12/2013 0937   MCH 29.7 04/12/2013 0937   MCHC 34.2 04/12/2013 0937   RDW 14.1 04/12/2013 0937   Iron Studies No results found for: "IRON", "TIBC", "FERRITIN", "IRONPCTSAT" Lipid Panel     Component Value Date/Time   CHOL 239 (H) 11/27/2021 1003   TRIG 225 (H) 11/27/2021 1003   HDL 47 11/27/2021 1003   CHOLHDL 3.7 04/12/2013 0937   VLDL 25 04/12/2013 0937   LDLCALC 151 (H) 11/27/2021 1003   Hepatic Function Panel     Component Value Date/Time   PROT 7.4 11/27/2021 1003   ALBUMIN 4.9 11/27/2021 1003   AST 33 11/27/2021 1003   ALT 33 (H) 11/27/2021 1003   ALKPHOS 108 11/27/2021 1003   BILITOT 0.5 11/27/2021 1003      Component Value Date/Time   TSH 2.250 11/27/2021 1003   Nutritional Lab Results   Component Value Date   VD25OH 27.1 (L) 11/27/2021     ASSESSMENT AND PLAN  TREATMENT PLAN FOR OBESITY:  Recommended Dietary Goals  Kayla Zimmerman is currently in the action stage of change. As such, her goal is to continue weight management plan. She has agreed to the Category 3 Plan.  Behavioral Intervention  We discussed the following Behavioral Modification Strategies today: increasing lean protein intake, decreasing simple carbohydrates , increasing vegetables, avoiding skipping meals, increasing water intake, work on meal planning and easy cooking plans, planning for success, and keeping healthy foods at home.  Additional resources provided today: NA  Recommended Physical Activity Goals  Kayla Zimmerman has been advised to work up to 150 minutes of moderate intensity aerobic activity a week and strengthening exercises 2-3 times per week for cardiovascular health, weight loss maintenance and preservation of muscle mass.   She has agreed to Continue current level of physical activity    Pharmacotherapy We discussed various medication options to help Kayla Zimmerman with her weight loss efforts and we both agreed to continue Zepbound 5mg .  ASSOCIATED CONDITIONS ADDRESSED TODAY  Action/Plan  Vitamin D deficiency -     Vitamin D (Ergocalciferol); Take 1 capsule (50,000 Units total) by mouth every 7 (seven) days.  Dispense: 5 capsule; Refill: 0.  Side effects discussed.   Low Vitamin D level contributes to fatigue and are associated with obesity, breast, and colon cancer. She agrees to continue to take prescription Vitamin D @50 ,000 IU every week and will follow-up for routine testing of Vitamin D, at least 2-3 times per year to avoid over-replacement.   Morbid obesity ([place]) -     Zepbound; Inject 5 mg into the skin once a week.  Dispense: 2 mL; Refill: 0  BMI 37.0-37.9, adult -     Zepbound; Inject 5 mg into the skin once a week.  Dispense: 2 mL; Refill: 0       Will check labs in April     Return in about 4 weeks (around 05/30/2022).Marland Kitchen She was informed of the importance of frequent follow up visits to maximize her success with intensive lifestyle modifications for her multiple health conditions.   ATTESTASTION STATEMENTS:  Reviewed by clinician on day of visit: allergies, medications, problem list, medical history, surgical history, family history, social history, and previous encounter notes.     Ailene Rud. Rosie Torrez FNP-C

## 2022-05-15 ENCOUNTER — Telehealth (INDEPENDENT_AMBULATORY_CARE_PROVIDER_SITE_OTHER): Payer: Self-pay | Admitting: Nurse Practitioner

## 2022-05-15 NOTE — Telephone Encounter (Signed)
Mychart message sent to patient.

## 2022-05-15 NOTE — Telephone Encounter (Signed)
Do you want to wait until her follow up appointment 06/04/22?

## 2022-05-15 NOTE — Telephone Encounter (Signed)
Pt called stating that she needs a different medication as her insurance will no longer cover Zepbound. Pt stated that Zepbound is $1000.00 without insurance. Pt wants to discuss an alternative for Zepbound. Please call patient at the number on file.

## 2022-05-16 ENCOUNTER — Other Ambulatory Visit: Payer: Self-pay | Admitting: Nurse Practitioner

## 2022-05-16 MED ORDER — ZEPBOUND 7.5 MG/0.5ML ~~LOC~~ SOAJ: 7.5000 mg | SUBCUTANEOUS | 0 refills | Status: DC

## 2022-05-20 ENCOUNTER — Telehealth (INDEPENDENT_AMBULATORY_CARE_PROVIDER_SITE_OTHER): Payer: Self-pay | Admitting: Nurse Practitioner

## 2022-05-20 NOTE — Telephone Encounter (Signed)
Patient is having trouble finding ZEPBOUND 7.5 Patient stated that medication is out everywhere. Kayla Zimmerman

## 2022-05-21 ENCOUNTER — Other Ambulatory Visit: Payer: Self-pay | Admitting: Nurse Practitioner

## 2022-05-21 DIAGNOSIS — R110 Nausea: Secondary | ICD-10-CM

## 2022-05-21 MED ORDER — ONDANSETRON HCL 4 MG PO TABS
4.0000 mg | ORAL_TABLET | Freq: Three times a day (TID) | ORAL | 0 refills | Status: DC | PRN
Start: 2022-05-21 — End: 2022-06-04

## 2022-05-21 MED ORDER — ZEPBOUND 7.5 MG/0.5ML ~~LOC~~ SOAJ: 7.5000 mg | SUBCUTANEOUS | 0 refills | Status: DC

## 2022-05-29 ENCOUNTER — Other Ambulatory Visit: Payer: Self-pay | Admitting: Nurse Practitioner

## 2022-06-04 ENCOUNTER — Encounter: Payer: Self-pay | Admitting: Nurse Practitioner

## 2022-06-04 ENCOUNTER — Ambulatory Visit: Payer: BC Managed Care – PPO | Admitting: Nurse Practitioner

## 2022-06-04 ENCOUNTER — Other Ambulatory Visit (HOSPITAL_COMMUNITY): Payer: Self-pay

## 2022-06-04 ENCOUNTER — Telehealth: Payer: Self-pay

## 2022-06-04 VITALS — BP 140/78 | HR 74 | Temp 98.4°F | Ht 63.0 in | Wt 216.0 lb

## 2022-06-04 DIAGNOSIS — R110 Nausea: Secondary | ICD-10-CM | POA: Diagnosis not present

## 2022-06-04 DIAGNOSIS — Z6838 Body mass index (BMI) 38.0-38.9, adult: Secondary | ICD-10-CM

## 2022-06-04 MED ORDER — ONDANSETRON HCL 4 MG PO TABS
4.0000 mg | ORAL_TABLET | Freq: Three times a day (TID) | ORAL | 0 refills | Status: DC | PRN
Start: 2022-06-04 — End: 2023-04-23

## 2022-06-04 MED ORDER — WEGOVY 0.5 MG/0.5ML ~~LOC~~ SOAJ
0.5000 mg | SUBCUTANEOUS | 0 refills | Status: DC
Start: 2022-06-04 — End: 2022-06-04

## 2022-06-04 MED ORDER — WEGOVY 0.5 MG/0.5ML ~~LOC~~ SOAJ
0.5000 mg | SUBCUTANEOUS | 0 refills | Status: DC
Start: 2022-06-04 — End: 2022-08-01
  Filled 2022-06-04: qty 2, 28d supply, fill #0

## 2022-06-04 NOTE — Patient Instructions (Signed)
What is a GLP-1 Glucagon like peptide-1 (GLP-1) agonists represent a class of medications used to treat type 2 diabetes mellitus and obesity.  GLP-1 medications mimic the action of a hormone called glucagon like peptide 1.  When [place] sugar levels start to rise/increase these drugs stimulate the body to produce more insulin.  When that happens, the extra insulin helps to lower the [place] sugar levels in the body.  This in returns helps with decreasing cravings.  These medications also slow the movement of food from the stomach into the small intestine.  This in return helps one to full faster and longer.    Diabetic medications: Approved for treatment of diabetes mellitus but does not have full approval for weight loss use Victoza (liraglutide) Ozempic (semaglutide) Mounjaro Trulicity Rybelsus  Weight loss medications: Approved for long-term weight loss use.        Saxenda (liraglutide) Wegovy (semaglutide) Zepbound  Contraindications:  Pancreatitis (active gallstones) Medullary thyroid cancer High triglycerides (>500)-will need labs prior to starting Multiple Endocrine Neoplasia syndrome type 2 (MEN 2) Trying to get pregnant Breastfeeding Use with caution with taking insulin or sulfonylureas (will need to monitor [place] sugars for hypoglycemia) Side effects (most common): Most common side effects are nausea, gas, bloating and constipation.  Other possible side effects are headaches, belching, diarrhea, tiredness (fatigue), vomiting, upset stomach, dizziness, heartburn and stomach (abdominal pain).  If you think that you are becoming dehydrated, please inform our office or your primary family provider.  Stop immediately and go to ER if you have any symptoms of a serious allergic reaction including swelling of your face, lips, tongue or throat; problems breathing or swallowing; severe rash or itching; fainting or feeling dizzy; or very rapid heart rate.                                                                                           

## 2022-06-04 NOTE — Telephone Encounter (Signed)
ZOXWRU:[id number];XBJYNW:[id number];Review Type:Prior Auth;Coverage Start Date:05/05/2022;Coverage End Date:12/31/2022;

## 2022-06-04 NOTE — Progress Notes (Signed)
Office: [phone]  /  Fax: [phone]  WEIGHT SUMMARY AND BIOMETRICS  No data recorded Weight Gained Since Last Visit: 3lb   Vitals Temp: 98.4 F (36.9 C) BP: (!) 140/78 Pulse Rate: 74 SpO2: 96 %   Anthropometric Measurements Height: 5\' 3"  (1.6 m) Weight: 216 lb (98 kg) BMI (Calculated): 38.27 Weight at Last Visit: 213lb Weight Gained Since Last Visit: 3lb Starting Weight: 230lb Total Weight Loss (lbs): 14 lb (6.35 kg)   Body Composition  Body Fat %: 44.6 % Fat Mass (lbs): 96.4 lbs Muscle Mass (lbs): 113.8 lbs Total Body Water (lbs): 89.6 lbs Visceral Fat Rating : 12   Other Clinical Data Fasting: Yes Labs: Yes Today's Visit #: 8 Starting Date: 11/27/21     HPI  Chief Complaint: OBESITY  Kayla Zimmerman is here to discuss her progress with her obesity treatment plan. She is on the the Category 3 Plan and states she is following her eating plan approximately 80 % of the time. She states she is exercising 40 minutes 5 days per week.   Interval History:  Since last office visit she has gained 3 pounds. Her highest weight was 243 lbs. Goal weight 130 lbs.     Pharmacotherapy for weight loss: She hasn't been able to take Zepbound due to shortage (hasn't taken in 3 weeks).  Her PA has been approved from 01/21/22-10/18/22.  Has to take zofran PRN due to side effects of nausea.    Previous pharmacotherapy for medical weight loss:  Has taken Phentermine in the past and stopped due to side effects of tachycardia   Bariatric surgery:  Patient has not had bariatric surgery.      PHYSICAL EXAM:  [place] pressure (!) 140/78, pulse 74, temperature 98.4 F (36.9 C), height 5\' 3"  (1.6 m), weight 216 lb (98 kg), last menstrual period 06/03/2022, SpO2 96 %. Body mass index is 38.26 kg/m.  General: She is overweight, cooperative, alert, well developed, and in no acute distress. PSYCH: Has normal mood, affect and thought process.   Extremities: No edema.  Neurologic: No  gross sensory or motor deficits. No tremors or fasciculations noted.    DIAGNOSTIC DATA REVIEWED:  BMET    Component Value Date/Time   NA 137 11/27/2021 1003   K 4.2 11/27/2021 1003   CL 100 11/27/2021 1003   CO2 20 11/27/2021 1003   GLUCOSE 114 (H) 11/27/2021 1003   GLUCOSE 102 (H) 04/12/2013 0937   BUN 5 (L) 11/27/2021 1003   CREATININE 0.72 11/27/2021 1003   CREATININE 0.60 04/12/2013 0937   CALCIUM 10.0 11/27/2021 1003   GFRNONAA >89 04/12/2013 0937   GFRAA >89 04/12/2013 0937   Lab Results  Component Value Date   HGBA1C 6.0 (H) 11/27/2021   Lab Results  Component Value Date   INSULIN 23.7 11/27/2021   Lab Results  Component Value Date   TSH 2.250 11/27/2021   CBC    Component Value Date/Time   WBC 7.7 04/12/2013 0937   RBC 4.68 04/12/2013 0937   HGB 13.9 04/12/2013 0937   HCT 40.6 04/12/2013 0937   PLT 356 04/12/2013 0937   MCV 86.8 04/12/2013 0937   MCH 29.7 04/12/2013 0937   MCHC 34.2 04/12/2013 0937   RDW 14.1 04/12/2013 0937   Iron Studies No results found for: "IRON", "TIBC", "FERRITIN", "IRONPCTSAT" Lipid Panel     Component Value Date/Time   CHOL 239 (H) 11/27/2021 1003   TRIG 225 (H) 11/27/2021 1003   HDL 47 11/27/2021 1003  CHOLHDL 3.7 04/12/2013 0937   VLDL 25 04/12/2013 0937   LDLCALC 151 (H) 11/27/2021 1003   Hepatic Function Panel     Component Value Date/Time   PROT 7.4 11/27/2021 1003   ALBUMIN 4.9 11/27/2021 1003   AST 33 11/27/2021 1003   ALT 33 (H) 11/27/2021 1003   ALKPHOS 108 11/27/2021 1003   BILITOT 0.5 11/27/2021 1003      Component Value Date/Time   TSH 2.250 11/27/2021 1003   Nutritional Lab Results  Component Value Date   VD25OH 27.1 (L) 11/27/2021     ASSESSMENT AND PLAN  TREATMENT PLAN FOR OBESITY:  Recommended Dietary Goals  Kayla Zimmerman is currently in the action stage of change. As such, her goal is to continue weight management plan. She has agreed to the Category 3 Plan.  Behavioral  Intervention  We discussed the following Behavioral Modification Strategies today: increasing lean protein intake, decreasing simple carbohydrates , increasing vegetables, increasing lower glycemic fruits, increasing fiber rich foods, avoiding skipping meals, increasing water intake, continue to practice mindfulness when eating, and planning for success.  Additional resources provided today: NA  Recommended Physical Activity Goals  Kayla Zimmerman has been advised to work up to 150 minutes of moderate intensity aerobic activity a week and strengthening exercises 2-3 times per week for cardiovascular health, weight loss maintenance and preservation of muscle mass.   She has agreed to Continue current level of physical activity    Pharmacotherapy We discussed various medication options to help Kayla Zimmerman with her weight loss efforts and we both agreed to start [facility] 0.5mg .  side effects discussed.    ASSOCIATED CONDITIONS ADDRESSED TODAY  Action/Plan  Nausea -     Ondansetron HCl; Take 1 tablet (4 mg total) by mouth every 8 (eight) hours as needed for nausea or vomiting.  Dispense: 20 tablet; Refill: 0  Morbid obesity ([facility]CC) -     ZOXWRU; Inject 0.5 mg into the skin once a week.  Dispense: 2 mL; Refill: 0  BMI 38.0-38.9,adult -     EAVWUJ; Inject 0.5 mg into the skin once a week.  Dispense: 2 mL; Refill: 0       Was going to obtain labs today but due to time limitation patient will check labs at next visit.    Return in about 3 weeks (around 06/25/2022).Marland Kitchen She was informed of the importance of frequent follow up visits to maximize her success with intensive lifestyle modifications for her multiple health conditions.   ATTESTASTION STATEMENTS:  Reviewed by clinician on day of visit: allergies, medications, problem list, medical history, surgical history, family history, social history, and previous encounter notes.     Theodis Sato. Math Brazie FNP-C

## 2022-06-04 NOTE — Telephone Encounter (Signed)
PA submitted through Cover My Meds for [facility]. Awaiting insurance determination.  Key: [id number]

## 2022-06-12 ENCOUNTER — Other Ambulatory Visit (HOSPITAL_COMMUNITY): Payer: Self-pay

## 2022-06-26 ENCOUNTER — Encounter: Payer: Self-pay | Admitting: Nurse Practitioner

## 2022-06-26 ENCOUNTER — Ambulatory Visit: Payer: BC Managed Care – PPO | Admitting: Nurse Practitioner

## 2022-06-26 VITALS — BP 129/78 | HR 75 | Temp 98.6°F | Ht 63.0 in | Wt 214.0 lb

## 2022-06-26 DIAGNOSIS — E559 Vitamin D deficiency, unspecified: Secondary | ICD-10-CM | POA: Diagnosis not present

## 2022-06-26 DIAGNOSIS — Z6837 Body mass index (BMI) 37.0-37.9, adult: Secondary | ICD-10-CM

## 2022-06-26 DIAGNOSIS — R7989 Other specified abnormal findings of blood chemistry: Secondary | ICD-10-CM | POA: Diagnosis not present

## 2022-06-26 DIAGNOSIS — R7303 Prediabetes: Secondary | ICD-10-CM | POA: Diagnosis not present

## 2022-06-26 DIAGNOSIS — Z79899 Other long term (current) drug therapy: Secondary | ICD-10-CM | POA: Diagnosis not present

## 2022-06-26 DIAGNOSIS — E782 Mixed hyperlipidemia: Secondary | ICD-10-CM | POA: Diagnosis not present

## 2022-06-26 MED ORDER — WEGOVY 1 MG/0.5ML ~~LOC~~ SOAJ
1.0000 mg | SUBCUTANEOUS | 0 refills | Status: DC
Start: 2022-06-26 — End: 2022-08-01

## 2022-06-26 NOTE — Progress Notes (Signed)
Office: [phone]  /  Fax: [phone]  WEIGHT SUMMARY AND BIOMETRICS  Weight Lost Since Last Visit: 2lb  No data recorded  Vitals Temp: 98.6 F (37 C) BP: 129/78 Pulse Rate: 75 SpO2: 96 %   Anthropometric Measurements Height: 5\' 3"  (1.6 m) Weight: 214 lb (97.1 kg) BMI (Calculated): 37.92 Weight at Last Visit: 216lb Weight Lost Since Last Visit: 2lb Starting Weight: 230lb Total Weight Loss (lbs): 16 lb (7.258 kg)   Body Composition  Body Fat %: 43.3 % Fat Mass (lbs): 92.8 lbs Muscle Mass (lbs): 115.4 lbs Total Body Water (lbs): 86.6 lbs Visceral Fat Rating : 12   Other Clinical Data Fasting: Yes Labs: Yes Today's Visit #: 9 Starting Date: 11/27/21     HPI  Chief Complaint: OBESITY  Kayla Zimmerman is here to discuss her progress with her obesity treatment plan. She is on the the Category 3 Plan and states she is following her eating plan approximately 70 % of the time. She states she is exercising 40 minutes 6 days per week.   Interval History:  Since last office visit she has gained 2 pounds. She states it "has been a bad diet month".  She had a root canal last week.  She is going on vacation in June.  She is drinking water daily.     Pharmacotherapy for weight loss: She is currently taking Wegovy 0.5mg  (3 doses-last dose tomorrow) for medical weight loss (Coverage Start Date:05/05/2022;Coverage End Date:12/31/2022).  Reports some mild side effects.    Previous pharmacotherapy for medical weight loss:   Has taken Phentermine in the past and stopped due to side effects of tachycardia (12/19/21)  She recently tried Zepbound and stopped due to side effects of nausea, diarrhea and constipation.    Bariatric surgery:  Patient has not had bariatric surgery.   Vit D deficiency  She is not currently taking Vit D or MV   Lab Results  Component Value Date   VD25OH 27.1 (L) 11/27/2021    Low Vit B12 Not currently taking a MV or Vit B12  Prediabetes Last  A1c was 6.0  Medication(s): Wegovy 0.5mg   Polyphagia:Yes Lab Results  Component Value Date   HGBA1C 6.0 (H) 11/27/2021   Lab Results  Component Value Date   INSULIN 23.7 11/27/2021     PHYSICAL EXAM:  [place] pressure 129/78, pulse 75, temperature 98.6 F (37 C), height 5\' 3"  (1.6 m), weight 214 lb (97.1 kg), last menstrual period 06/03/2022, SpO2 96 %. Body mass index is 37.91 kg/m.  General: She is overweight, cooperative, alert, well developed, and in no acute distress. PSYCH: Has normal mood, affect and thought process.   Extremities: No edema.  Neurologic: No gross sensory or motor deficits. No tremors or fasciculations noted.    DIAGNOSTIC DATA REVIEWED:  BMET    Component Value Date/Time   NA 137 11/27/2021 1003   K 4.2 11/27/2021 1003   CL 100 11/27/2021 1003   CO2 20 11/27/2021 1003   GLUCOSE 114 (H) 11/27/2021 1003   GLUCOSE 102 (H) 04/12/2013 0937   BUN 5 (L) 11/27/2021 1003   CREATININE 0.72 11/27/2021 1003   CREATININE 0.60 04/12/2013 0937   CALCIUM 10.0 11/27/2021 1003   GFRNONAA >89 04/12/2013 0937   GFRAA >89 04/12/2013 0937   Lab Results  Component Value Date   HGBA1C 6.0 (H) 11/27/2021   Lab Results  Component Value Date   INSULIN 23.7 11/27/2021   Lab Results  Component Value Date   TSH  2.250 11/27/2021   CBC    Component Value Date/Time   WBC 7.7 04/12/2013 0937   RBC 4.68 04/12/2013 0937   HGB 13.9 04/12/2013 0937   HCT 40.6 04/12/2013 0937   PLT 356 04/12/2013 0937   MCV 86.8 04/12/2013 0937   MCH 29.7 04/12/2013 0937   MCHC 34.2 04/12/2013 0937   RDW 14.1 04/12/2013 0937   Iron Studies No results found for: "IRON", "TIBC", "FERRITIN", "IRONPCTSAT" Lipid Panel     Component Value Date/Time   CHOL 239 (H) 11/27/2021 1003   TRIG 225 (H) 11/27/2021 1003   HDL 47 11/27/2021 1003   CHOLHDL 3.7 04/12/2013 0937   VLDL 25 04/12/2013 0937   LDLCALC 151 (H) 11/27/2021 1003   Hepatic Function Panel     Component Value  Date/Time   PROT 7.4 11/27/2021 1003   ALBUMIN 4.9 11/27/2021 1003   AST 33 11/27/2021 1003   ALT 33 (H) 11/27/2021 1003   ALKPHOS 108 11/27/2021 1003   BILITOT 0.5 11/27/2021 1003      Component Value Date/Time   TSH 2.250 11/27/2021 1003   Nutritional Lab Results  Component Value Date   VD25OH 27.1 (L) 11/27/2021     ASSESSMENT AND PLAN  TREATMENT PLAN FOR OBESITY:  Recommended Dietary Goals  Ilana is currently in the action stage of change. As such, her goal is to continue weight management plan. She has agreed to the Category 3 Plan.  Behavioral Intervention  We discussed the following Behavioral Modification Strategies today: increasing lean protein intake, decreasing simple carbohydrates , increasing vegetables, increasing lower glycemic fruits, increasing fiber rich foods, avoiding skipping meals, increasing water intake, continue to practice mindfulness when eating, and planning for success.  Additional resources provided today: NA  Recommended Physical Activity Goals  Amanda-Jane has been advised to work up to 150 minutes of moderate intensity aerobic activity a week and strengthening exercises 2-3 times per week for cardiovascular health, weight loss maintenance and preservation of muscle mass.   She has agreed to Continue current level of physical activity    Pharmacotherapy We discussed various medication options to help Geanette with her weight loss efforts and we both agreed to continue [facility] 0.5mg  and increase to 1mg  once finished with 0.5mg .  ASSOCIATED CONDITIONS ADDRESSED TODAY  Action/Plan  Mixed hyperlipidemia -     Lipid Panel With LDL/HDL Ratio  Cardiovascular risk and specific lipid/LDL goals reviewed.  We discussed several lifestyle modifications today and Daylynn will continue to work on diet, exercise and weight loss efforts. Orders and follow up as documented in patient record.   Counseling Intensive lifestyle modifications are the first line  treatment for this issue. Dietary changes: Increase soluble fiber. Decrease simple carbohydrates. Exercise changes: Moderate to vigorous-intensity aerobic activity 150 minutes per week if tolerated. Lipid-lowering medications: see documented in medical record.   Prediabetes -     Hemoglobin A1c -     Insulin, random  Revecca will continue to work on weight loss, exercise, and decreasing simple carbohydrates to help decrease the risk of diabetes.    Low vitamin B12 level -     Vitamin B12  Medication management -     Comprehensive metabolic panel  Vitamin D deficiency -     VITAMIN D 25 Hydroxy (Vit-D Deficiency, Fractures)  Morbid obesity (HCC) -     RUEAVW; Inject 1 mg into the skin once a week.  Dispense: 2 mL; Refill: 0  BMI 37.0-37.9, adult -     [id number];  Inject 1 mg into the skin once a week.  Dispense: 2 mL; Refill: 0         Return in about 4 weeks (around 07/24/2022).Marland Kitchen She was informed of the importance of frequent follow up visits to maximize her success with intensive lifestyle modifications for her multiple health conditions.   ATTESTASTION STATEMENTS:  Reviewed by clinician on day of visit: allergies, medications, problem list, medical history, surgical history, family history, social history, and previous encounter notes.     Theodis Sato. Hawa Henly FNP-C

## 2022-06-27 ENCOUNTER — Other Ambulatory Visit (HOSPITAL_COMMUNITY): Payer: Self-pay

## 2022-06-27 LAB — LIPID PANEL WITH LDL/HDL RATIO
Cholesterol, Total: 209 mg/dL — ABNORMAL HIGH (ref 100–199)
HDL: 52 mg/dL (ref >39)
LDL Chol Calc (NIH): 125 mg/dL — ABNORMAL HIGH (ref 0–99)
LDL/HDL Ratio: 2.4 ratio (ref 0.0–3.2)
Triglycerides: 179 mg/dL — ABNORMAL HIGH (ref 0–149)
VLDL Cholesterol Cal: 32 mg/dL (ref 5–40)

## 2022-06-27 LAB — VITAMIN B12: Vitamin B-12: 245 pg/mL (ref 232–1245)

## 2022-06-27 LAB — COMPREHENSIVE METABOLIC PANEL
ALT: 10 IU/L (ref 0–32)
AST: 12 IU/L (ref 0–40)
Albumin/Globulin Ratio: 1.7 (ref 1.2–2.2)
Albumin: 4.6 g/dL (ref 3.9–4.9)
Alkaline Phosphatase: 115 IU/L (ref 44–121)
BUN/Creatinine Ratio: 13 (ref 9–23)
BUN: 8 mg/dL (ref 6–24)
Bilirubin Total: 0.4 mg/dL (ref 0.0–1.2)
CO2: 21 mmol/L (ref 20–29)
Calcium: 10.1 mg/dL (ref 8.7–10.2)
Chloride: 102 mmol/L (ref 96–106)
Creatinine, Ser: 0.64 mg/dL (ref 0.57–1.00)
Globulin, Total: 2.7 g/dL (ref 1.5–4.5)
Glucose: 93 mg/dL (ref 70–99)
Potassium: 4.4 mmol/L (ref 3.5–5.2)
Sodium: 139 mmol/L (ref 134–144)
Total Protein: 7.3 g/dL (ref 6.0–8.5)
eGFR: 110 mL/min/{1.73_m2} (ref >59)

## 2022-06-27 LAB — HEMOGLOBIN A1C
Est. average glucose Bld gHb Est-mCnc: 117 mg/dL
Hgb A1c MFr Bld: 5.7 % — ABNORMAL HIGH (ref 4.8–5.6)

## 2022-06-27 LAB — INSULIN, RANDOM: INSULIN: 33.5 u[IU]/mL — ABNORMAL HIGH (ref 2.6–24.9)

## 2022-06-27 LAB — VITAMIN D 25 HYDROXY (VIT D DEFICIENCY, FRACTURES): Vit D, 25-Hydroxy: 29.5 ng/mL — ABNORMAL LOW (ref 30.0–100.0)

## 2022-06-28 ENCOUNTER — Other Ambulatory Visit: Payer: Self-pay

## 2022-07-22 ENCOUNTER — Other Ambulatory Visit (HOSPITAL_COMMUNITY): Payer: Self-pay

## 2022-07-25 ENCOUNTER — Other Ambulatory Visit: Payer: Self-pay | Admitting: Nurse Practitioner

## 2022-07-25 DIAGNOSIS — Z1231 Encounter for screening mammogram for malignant neoplasm of breast: Secondary | ICD-10-CM

## 2022-08-01 ENCOUNTER — Encounter: Payer: Self-pay | Admitting: Nurse Practitioner

## 2022-08-01 ENCOUNTER — Ambulatory Visit: Payer: BC Managed Care – PPO | Admitting: Nurse Practitioner

## 2022-08-01 VITALS — BP 128/78 | HR 68 | Temp 98.0°F | Ht 63.0 in | Wt 213.0 lb

## 2022-08-01 DIAGNOSIS — R7989 Other specified abnormal findings of blood chemistry: Secondary | ICD-10-CM

## 2022-08-01 DIAGNOSIS — Z6837 Body mass index (BMI) 37.0-37.9, adult: Secondary | ICD-10-CM

## 2022-08-01 DIAGNOSIS — R7303 Prediabetes: Secondary | ICD-10-CM

## 2022-08-01 DIAGNOSIS — E559 Vitamin D deficiency, unspecified: Secondary | ICD-10-CM

## 2022-08-01 MED ORDER — WEGOVY 1.7 MG/0.75ML ~~LOC~~ SOAJ
1.7000 mg | SUBCUTANEOUS | 0 refills | Status: DC
Start: 2022-08-01 — End: 2022-08-22

## 2022-08-01 MED ORDER — VITAMIN D (ERGOCALCIFEROL) 1.25 MG (50000 UNIT) PO CAPS: 50000.0000 [IU] | ORAL_CAPSULE | ORAL | 0 refills | Status: DC

## 2022-08-01 NOTE — Progress Notes (Signed)
Office: [phone]  /  Fax: [phone]  WEIGHT SUMMARY AND BIOMETRICS  Weight Lost Since Last Visit: 1lb  No data recorded  Vitals Temp: 98 F (36.7 C) BP: 128/78 Pulse Rate: 68 SpO2: 98 %   Anthropometric Measurements Height: 5\' 3"  (1.6 m) Weight: 213 lb (96.6 kg) BMI (Calculated): 37.74 Weight at Last Visit: 214lb Weight Lost Since Last Visit: 1lb Starting Weight: 230lb Total Weight Loss (lbs): 17 lb (7.711 kg)   Body Composition  Body Fat %: 43.4 % Fat Mass (lbs): 92.8 lbs Muscle Mass (lbs): 114.8 lbs Total Body Water (lbs): 86 lbs Visceral Fat Rating : 12   Other Clinical Data Fasting: Yes Labs: No Today's Visit #: 10 Starting Date: 11/27/21     HPI  Chief Complaint: OBESITY  Maddix is here to discuss her progress with her obesity treatment plan. She is on the the Category 3 Plan and states she is following her eating plan approximately 90 % of the time. She states she is exercising 30 minutes 6 days per week.   Interval History:  Since last office visit she has lost 1 pound.  She started a new part time job since her last visit.  She is meeting her protein and calorie goals. She is drinking 64-96 oz of water daily.  Her next goal is to <200 lbs and start running again.    Pharmacotherapy for weight loss: She is currently taking Wegovy 1 mg (taken 4 doses) for medical weight loss.  Denies side effects.  (Prior Auth;Coverage Start Date:05/05/2022;Coverage End Date:12/31/2022)  Previous pharmacotherapy for medical weight loss:  Zepbound and Phentermine  She stopped Phentermine due to side effects of tachycardia (12/19/21)  She stopped Zepbound due to side effects of nausea, diarrhea and constipation.    Bariatric surgery:  Patient has not had bariatric surgery.   Low Vit B12 Not currently taking Vit B12.  Has taken Vit B12 injections in the past.    Prediabetes Last A1c was 5.7  Medication(s): Wegovy 1mg .  Denies side effects.    Lab  Results  Component Value Date   HGBA1C 5.7 (H) 06/26/2022   HGBA1C 6.0 (H) 11/27/2021   Lab Results  Component Value Date   INSULIN 33.5 (H) 06/26/2022   INSULIN 23.7 11/27/2021   Vit D deficiency  She is not currently taking Vit D.  Has taken Vit D 50,000 IU in the past.    Lab Results  Component Value Date   VD25OH 29.5 (L) 06/26/2022   VD25OH 27.1 (L) 11/27/2021    Hyperlipidemia Medication(s): none FH:  none  Lab Results  Component Value Date   CHOL 209 (H) 06/26/2022   HDL 52 06/26/2022   LDLCALC 125 (H) 06/26/2022   TRIG 179 (H) 06/26/2022   CHOLHDL 3.7 04/12/2013   Lab Results  Component Value Date   ALT 10 06/26/2022   AST 12 06/26/2022   ALKPHOS 115 06/26/2022   BILITOT 0.4 06/26/2022   The 10-year ASCVD risk score (Arnett DK, et al., 2019) is: 1%   Values used to calculate the score:     Age: 47 years     Sex: Female     Is Non-Hispanic African American: No     Diabetic: No     Tobacco smoker: No     Systolic [place] Pressure: 128 mmHg     Is BP treated: No     HDL Cholesterol: 52 mg/dL     Total Cholesterol: 209 mg/dL   PHYSICAL EXAM:  [place] pressure 128/78, pulse 68, temperature 98 F (36.7 C), height 5\' 3"  (1.6 m), weight 213 lb (96.6 kg), last menstrual period 07/03/2022, SpO2 98 %. Body mass index is 37.73 kg/m.  General: She is overweight, cooperative, alert, well developed, and in no acute distress. PSYCH: Has normal mood, affect and thought process.   Extremities: No edema.  Neurologic: No gross sensory or motor deficits. No tremors or fasciculations noted.    DIAGNOSTIC DATA REVIEWED:  BMET    Component Value Date/Time   NA 139 06/26/2022 1242   K 4.4 06/26/2022 1242   CL 102 06/26/2022 1242   CO2 21 06/26/2022 1242   GLUCOSE 93 06/26/2022 1242   GLUCOSE 102 (H) 04/12/2013 0937   BUN 8 06/26/2022 1242   CREATININE 0.64 06/26/2022 1242   CREATININE 0.60 04/12/2013 0937   CALCIUM 10.1 06/26/2022 1242   GFRNONAA >89 04/12/2013  0937   GFRAA >89 04/12/2013 0937   Lab Results  Component Value Date   HGBA1C 5.7 (H) 06/26/2022   HGBA1C 6.0 (H) 11/27/2021   Lab Results  Component Value Date   INSULIN 33.5 (H) 06/26/2022   INSULIN 23.7 11/27/2021   Lab Results  Component Value Date   TSH 2.250 11/27/2021   CBC    Component Value Date/Time   WBC 7.7 04/12/2013 0937   RBC 4.68 04/12/2013 0937   HGB 13.9 04/12/2013 0937   HCT 40.6 04/12/2013 0937   PLT 356 04/12/2013 0937   MCV 86.8 04/12/2013 0937   MCH 29.7 04/12/2013 0937   MCHC 34.2 04/12/2013 0937   RDW 14.1 04/12/2013 0937   Iron Studies No results found for: "IRON", "TIBC", "FERRITIN", "IRONPCTSAT" Lipid Panel     Component Value Date/Time   CHOL 209 (H) 06/26/2022 1242   TRIG 179 (H) 06/26/2022 1242   HDL 52 06/26/2022 1242   CHOLHDL 3.7 04/12/2013 0937   VLDL 25 04/12/2013 0937   LDLCALC 125 (H) 06/26/2022 1242   Hepatic Function Panel     Component Value Date/Time   PROT 7.3 06/26/2022 1242   ALBUMIN 4.6 06/26/2022 1242   AST 12 06/26/2022 1242   ALT 10 06/26/2022 1242   ALKPHOS 115 06/26/2022 1242   BILITOT 0.4 06/26/2022 1242      Component Value Date/Time   TSH 2.250 11/27/2021 1003   Nutritional Lab Results  Component Value Date   VD25OH 29.5 (L) 06/26/2022   VD25OH 27.1 (L) 11/27/2021     ASSESSMENT AND PLAN  TREATMENT PLAN FOR OBESITY:  Recommended Dietary Goals  Maeghan is currently in the action stage of change. As such, her goal is to continue weight management plan. She has agreed to the Category 3 Plan.+ 200 calories  Behavioral Intervention  We discussed the following Behavioral Modification Strategies today: increasing lean protein intake, decreasing simple carbohydrates , increasing vegetables, increasing lower glycemic fruits, increasing fiber rich foods, avoiding skipping meals, increasing water intake, continue to practice mindfulness when eating, and planning for success.  Additional resources  provided today: NA  Recommended Physical Activity Goals  Chayil has been advised to work up to 150 minutes of moderate intensity aerobic activity a week and strengthening exercises 2-3 times per week for cardiovascular health, weight loss maintenance and preservation of muscle mass.   She has agreed to Continue current level of physical activity  and Think about ways to increase daily physical activity and overcoming barriers to exercise   Pharmacotherapy We discussed various medication options to help Ndia with her weight  loss efforts and we both agreed to increase Wegovy 1.7mg .  Side effects discussed.  Husband has had a vasectomy.  ASSOCIATED CONDITIONS ADDRESSED TODAY  Action/Plan  Low vitamin B12 level Patient would like to discuss starting Vit B12 injections with PCP.  Offered to send in a prescription but patient doesn't want to do her Vit B12 injections at home.    Prediabetes Samauri will continue to work on weight loss, exercise, and decreasing simple carbohydrates to help decrease the risk of diabetes.    Vitamin D deficiency -     Vitamin D (Ergocalciferol); Take 1 capsule (50,000 Units total) by mouth every 7 (seven) days.  Dispense: 12 capsule; Refill: 0  Morbid obesity (HCC) -     ZOXWRU; Inject 1.7 mg into the skin once a week.  Dispense: 3 mL; Refill: 0  BMI 37.0-37.9, adult -     EAVWUJ; Inject 1.7 mg into the skin once a week.  Dispense: 3 mL; Refill: 0      Labs reviewed in chart with patient from 06/26/22   Return in about 4 weeks (around 08/29/2022).Marland Kitchen She was informed of the importance of frequent follow up visits to maximize her success with intensive lifestyle modifications for her multiple health conditions.   ATTESTASTION STATEMENTS:  Reviewed by clinician on day of visit: allergies, medications, problem list, medical history, surgical history, family history, social history, and previous encounter notes.     Theodis Sato. Citlali Gautney FNP-C

## 2022-08-14 ENCOUNTER — Ambulatory Visit
Admission: RE | Admit: 2022-08-14 | Discharge: 2022-08-14 | Disposition: A | Discharge status: Home / Self Care | Payer: BC Managed Care – PPO | Source: Ambulatory Visit | Attending: Nurse Practitioner | Admitting: Nurse Practitioner

## 2022-08-14 DIAGNOSIS — Z1231 Encounter for screening mammogram for malignant neoplasm of breast: Secondary | ICD-10-CM

## 2022-08-22 ENCOUNTER — Ambulatory Visit: Payer: BC Managed Care – PPO | Admitting: Nurse Practitioner

## 2022-08-22 ENCOUNTER — Encounter: Payer: Self-pay | Admitting: Nurse Practitioner

## 2022-08-22 VITALS — BP 121/72 | HR 75 | Temp 98.2°F | Ht 63.0 in | Wt 210.0 lb

## 2022-08-22 DIAGNOSIS — Z6837 Body mass index (BMI) 37.0-37.9, adult: Secondary | ICD-10-CM | POA: Diagnosis not present

## 2022-08-22 DIAGNOSIS — R7989 Other specified abnormal findings of blood chemistry: Secondary | ICD-10-CM | POA: Diagnosis not present

## 2022-08-22 MED ORDER — CYANOCOBALAMIN 1000 MCG/ML IJ SOLN
1000.0000 ug | INTRAMUSCULAR | 4 refills | Status: DC
Start: 2022-08-22 — End: 2023-01-27

## 2022-08-22 MED ORDER — BD DISP NEEDLE 25G X 1" MISC
0 refills | Status: DC
Start: 2022-08-22 — End: 2023-04-24

## 2022-08-22 MED ORDER — WEGOVY 2.4 MG/0.75ML ~~LOC~~ SOAJ
2.4000 mg | SUBCUTANEOUS | 0 refills | Status: DC
Start: 2022-08-22 — End: 2022-09-12

## 2022-08-22 NOTE — Progress Notes (Signed)
Office: [phone]  /  Fax: [phone]  WEIGHT SUMMARY AND BIOMETRICS  Weight Lost Since Last Visit: 3lb  Weight Gained Since Last Visit: 0lb   Vitals Temp: 98.2 F (36.8 C) BP: 121/72 Pulse Rate: 75 SpO2: 95 %   Anthropometric Measurements Height: 5\' 3"  (1.6 m) Weight: 210 lb (95.3 kg) BMI (Calculated): 37.21 Weight at Last Visit: 213lb Weight Lost Since Last Visit: 3lb Weight Gained Since Last Visit: 0lb Starting Weight: 230lb Total Weight Loss (lbs): 20 lb (9.072 kg)   Body Composition  Body Fat %: 42.4 % Fat Mass (lbs): 89.2 lbs Muscle Mass (lbs): 114.8 lbs Total Body Water (lbs): 84.2 lbs Visceral Fat Rating : 11   Other Clinical Data Fasting: Yes Labs: No Today's Visit #: 11 Starting Date: 11/27/21     HPI  Chief Complaint: OBESITY  Kayla Zimmerman is here to discuss her progress with her obesity treatment plan. She is on the the Category 3 Plan and states she is following her eating plan approximately 85 % of the time. She states she is exercising 30 minutes 6 days per week.   Interval History:  Since last office visit she has lost 3 pounds.  Her highest weight was 243.9 pounds.     Pharmacotherapy for weight loss: She is currently taking Wegovy 1.7mg  for medical weight loss.  Denies side effects.    (Prior Auth;Coverage Start Date:05/05/2022;Coverage End Date:12/31/2022)   Previous pharmacotherapy for medical weight loss:  Zepbound and Phentermine   She stopped Phentermine due to side effects of tachycardia (12/19/21)  She stopped Zepbound due to side effects of nausea, diarrhea and constipation.    Bariatric surgery:  She has not had bariatric surgery   Vit B12 def Started SL Vit B12 since last visit.  Denies side effects.    PHYSICAL EXAM:  Kayla Zimmerman pressure 121/72, pulse 75, temperature 98.2 F (36.8 C), height 5\' 3"  (1.6 m), weight 210 lb (95.3 kg), last menstrual period 08/08/2022, SpO2 95%. Body mass index is 37.2 kg/m.  General:  She is overweight, cooperative, alert, well developed, and in no acute distress. PSYCH: Has normal mood, affect and thought process.   Extremities: No edema.  Neurologic: No gross sensory or motor deficits. No tremors or fasciculations noted.    DIAGNOSTIC DATA REVIEWED:  BMET    Component Value Date/Time   NA 139 06/26/2022 1242   K 4.4 06/26/2022 1242   CL 102 06/26/2022 1242   CO2 21 06/26/2022 1242   GLUCOSE 93 06/26/2022 1242   GLUCOSE 102 (H) 04/12/2013 0937   BUN 8 06/26/2022 1242   CREATININE 0.64 06/26/2022 1242   CREATININE 0.60 04/12/2013 0937   CALCIUM 10.1 06/26/2022 1242   GFRNONAA >89 04/12/2013 0937   GFRAA >89 04/12/2013 0937   Lab Results  Component Value Date   HGBA1C 5.7 (H) 06/26/2022   HGBA1C 6.0 (H) 11/27/2021   Lab Results  Component Value Date   INSULIN 33.5 (H) 06/26/2022   INSULIN 23.7 11/27/2021   Lab Results  Component Value Date   TSH 2.250 11/27/2021   CBC    Component Value Date/Time   WBC 7.7 04/12/2013 0937   RBC 4.68 04/12/2013 0937   HGB 13.9 04/12/2013 0937   HCT 40.6 04/12/2013 0937   PLT 356 04/12/2013 0937   MCV 86.8 04/12/2013 0937   MCH 29.7 04/12/2013 0937   MCHC 34.2 04/12/2013 0937   RDW 14.1 04/12/2013 0937   Iron Studies No results found for: "IRON", "TIBC", "FERRITIN", "IRONPCTSAT"  Lipid Panel     Component Value Date/Time   CHOL 209 (H) 06/26/2022 1242   TRIG 179 (H) 06/26/2022 1242   HDL 52 06/26/2022 1242   CHOLHDL 3.7 04/12/2013 0937   VLDL 25 04/12/2013 0937   LDLCALC 125 (H) 06/26/2022 1242   Hepatic Function Panel     Component Value Date/Time   PROT 7.3 06/26/2022 1242   ALBUMIN 4.6 06/26/2022 1242   AST 12 06/26/2022 1242   ALT 10 06/26/2022 1242   ALKPHOS 115 06/26/2022 1242   BILITOT 0.4 06/26/2022 1242      Component Value Date/Time   TSH 2.250 11/27/2021 1003   Nutritional Lab Results  Component Value Date   VD25OH 29.5 (L) 06/26/2022   VD25OH 27.1 (L) 11/27/2021      ASSESSMENT AND PLAN  TREATMENT PLAN FOR OBESITY:  Recommended Dietary Goals  Kayla Zimmerman is currently in the action stage of change. As such, her goal is to continue weight management plan. She has agreed to the Category 3 Plan.  Behavioral Intervention  We discussed the following Behavioral Modification Strategies today: increasing lean protein intake, decreasing simple carbohydrates , increasing vegetables, increasing lower glycemic fruits, increasing water intake, work on meal planning and preparation, reading food labels , keeping healthy foods at home, continue to practice mindfulness when eating, and planning for success.  Additional resources provided today: NA  Recommended Physical Activity Goals  Kayla Zimmerman has been advised to work up to 150 minutes of moderate intensity aerobic activity a week and strengthening exercises 2-3 times per week for cardiovascular health, weight loss maintenance and preservation of muscle mass.   She has agreed to Continue current level of physical activity    ASSOCIATED CONDITIONS ADDRESSED TODAY  Action/Plan  Low vitamin B12 level -     Cyanocobalamin; Inject 1 mL (1,000 mcg total) into the muscle every 30 (thirty) days.  Dispense: 1 mL; Refill: 4 -     BD Disp Needle; Take as directed with Vit B12  Dispense: 10 each; Refill: 0  Side effects discussed  Morbid obesity (HCC) -     WGNFAO; Inject 2.4 mg into the skin once a week.  Dispense: 3 mL; Refill: 0 Side effects discussed.   BMI 37.0-37.9, adult -     ZHYQMV; Inject 2.4 mg into the skin once a week.  Dispense: 3 mL; Refill: 0         Return in about 4 weeks (around 09/19/2022).Marland Kitchen She was informed of the importance of frequent follow up visits to maximize her success with intensive lifestyle modifications for her multiple health conditions.   ATTESTASTION STATEMENTS:  Reviewed by clinician on day of visit: allergies, medications, problem list, medical history, surgical history,  family history, social history, and previous encounter notes.      Theodis Sato. Wyman Meschke FNP-C

## 2022-09-12 ENCOUNTER — Telehealth: Payer: Self-pay | Admitting: Bariatrics

## 2022-09-12 ENCOUNTER — Telehealth: Payer: Self-pay

## 2022-09-12 ENCOUNTER — Ambulatory Visit: Payer: BC Managed Care – PPO | Admitting: Bariatrics

## 2022-09-12 ENCOUNTER — Other Ambulatory Visit (INDEPENDENT_AMBULATORY_CARE_PROVIDER_SITE_OTHER): Payer: Self-pay | Admitting: Bariatrics

## 2022-09-12 DIAGNOSIS — Z6837 Body mass index (BMI) 37.0-37.9, adult: Secondary | ICD-10-CM

## 2022-09-12 MED ORDER — WEGOVY 2.4 MG/0.75ML ~~LOC~~ SOAJ
2.4000 mg | SUBCUTANEOUS | 0 refills | Status: DC
Start: 2022-09-12 — End: 2022-09-24

## 2022-09-12 NOTE — Telephone Encounter (Signed)
PA submitted through Cover My Meds for [facility]. Awaiting insurance determination. Key: [id number]

## 2022-09-12 NOTE — Telephone Encounter (Signed)
Per Cover My Meds: CaseId:[id number];Status:Cancelled;Explanation:PA not Required. Medication is Covered;

## 2022-09-12 NOTE — Telephone Encounter (Signed)
Pt was schedule to day for an appt. Due to the office closing Dr. Manson Passey didn't have any available appt.. She will need a refill on Wegovy for this month and she has a follow up appt with [facility] on 10/08/22

## 2022-09-19 ENCOUNTER — Telehealth (INDEPENDENT_AMBULATORY_CARE_PROVIDER_SITE_OTHER): Payer: Self-pay | Admitting: Nurse Practitioner

## 2022-09-19 NOTE — Telephone Encounter (Signed)
Wegovy was filled 09/12/22.

## 2022-09-19 NOTE — Telephone Encounter (Signed)
The patient has requested a refill for [name] after having to cancel her last appointment due to the inclement weather. Additionally, she has confirmed her visit today at 2:00 pm for a B-12 shot.

## 2022-09-24 ENCOUNTER — Other Ambulatory Visit (INDEPENDENT_AMBULATORY_CARE_PROVIDER_SITE_OTHER): Payer: Self-pay | Admitting: Bariatrics

## 2022-09-24 ENCOUNTER — Encounter: Payer: Self-pay | Admitting: Nurse Practitioner

## 2022-09-24 DIAGNOSIS — Z6837 Body mass index (BMI) 37.0-37.9, adult: Secondary | ICD-10-CM

## 2022-09-24 MED ORDER — WEGOVY 2.4 MG/0.75ML ~~LOC~~ SOAJ: 2.4000 mg | SUBCUTANEOUS | 0 refills | Status: DC

## 2022-10-08 ENCOUNTER — Ambulatory Visit: Payer: BC Managed Care – PPO | Admitting: Nurse Practitioner

## 2022-10-08 ENCOUNTER — Encounter: Payer: Self-pay | Admitting: Nurse Practitioner

## 2022-10-08 VITALS — BP 123/79 | HR 71 | Temp 98.3°F | Ht 63.0 in | Wt 205.0 lb

## 2022-10-08 DIAGNOSIS — Z6836 Body mass index (BMI) 36.0-36.9, adult: Secondary | ICD-10-CM | POA: Diagnosis not present

## 2022-10-08 DIAGNOSIS — R7989 Other specified abnormal findings of blood chemistry: Secondary | ICD-10-CM

## 2022-10-08 MED ORDER — WEGOVY 2.4 MG/0.75ML ~~LOC~~ SOAJ
2.4000 mg | SUBCUTANEOUS | 0 refills | Status: DC
Start: 2022-10-08 — End: 2022-10-24

## 2022-10-08 NOTE — Progress Notes (Signed)
Office: [phone]  /  Fax: [phone]  WEIGHT SUMMARY AND BIOMETRICS  Weight Lost Since Last Visit: 5lb  Weight Gained Since Last Visit: 0lb   Vitals Temp: 98.3 F (36.8 C) BP: 123/79 Pulse Rate: 71 SpO2: 96 %   Anthropometric Measurements Height: 5\' 3"  (1.6 m) Weight: 205 lb (93 kg) BMI (Calculated): 36.32 Weight at Last Visit: 2210lb Weight Lost Since Last Visit: 5lb Weight Gained Since Last Visit: 0lb Starting Weight: 230lb Total Weight Loss (lbs): 25 lb (11.3 kg)   Body Composition  Body Fat %: 43.2 % Fat Mass (lbs): 89 lbs Muscle Mass (lbs): 110.8 lbs Total Body Water (lbs): 87.8 lbs Visceral Fat Rating : 11   Other Clinical Data Fasting: Yes Labs: No Today's Visit #: 12 Starting Date: 11/27/21     HPI  Chief Complaint: OBESITY  Kayla Zimmerman is here to discuss her progress with her obesity treatment plan. She is on the the Category 3 Plan and states she is following her eating plan approximately 50 % of the time. She states she is exercising 25-30 minutes 3 days per week.   Interval History:  Since last office visit she has lost 5 pounds. Since her last visit, she had Covid and got off track while she was sick. She is overall is feeling better and is getting back on track.     Pharmacotherapy for weight loss: She is currently taking Wegovy 2.4mg  for medical weight loss.  Denies side effects.    Coverage End Date:12/31/2022  Previous pharmacotherapy for medical weight loss:   Zepbound and Phentermine   She stopped Phentermine due to side effects of tachycardia (12/19/21)  She stopped Zepbound due to side effects of nausea, diarrhea and constipation.    Bariatric surgery:  Patient has not had bariatric surgery.    Vit B12 def Taking Vit B12 monthly.  Has taken one dose. Due for second dose today.  Denies side effects.  Notes that her energy is better.   PHYSICAL EXAM:  Kayla Zimmerman pressure 123/79, pulse 71, temperature 98.3 F (36.8 C), height  5\' 3"  (1.6 m), weight 205 lb (93 kg), last menstrual period 09/20/2022, SpO2 96%. Body mass index is 36.31 kg/m.  General: She is overweight, cooperative, alert, well developed, and in no acute distress. PSYCH: Has normal mood, affect and thought process.   Extremities: No edema.  Neurologic: No gross sensory or motor deficits. No tremors or fasciculations noted.    DIAGNOSTIC DATA REVIEWED:  BMET    Component Value Date/Time   NA 139 06/26/2022 1242   K 4.4 06/26/2022 1242   CL 102 06/26/2022 1242   CO2 21 06/26/2022 1242   GLUCOSE 93 06/26/2022 1242   GLUCOSE 102 (H) 04/12/2013 0937   BUN 8 06/26/2022 1242   CREATININE 0.64 06/26/2022 1242   CREATININE 0.60 04/12/2013 0937   CALCIUM 10.1 06/26/2022 1242   GFRNONAA >89 04/12/2013 0937   GFRAA >89 04/12/2013 0937   Lab Results  Component Value Date   HGBA1C 5.7 (H) 06/26/2022   HGBA1C 6.0 (H) 11/27/2021   Lab Results  Component Value Date   INSULIN 33.5 (H) 06/26/2022   INSULIN 23.7 11/27/2021   Lab Results  Component Value Date   TSH 2.250 11/27/2021   CBC    Component Value Date/Time   WBC 7.7 04/12/2013 0937   RBC 4.68 04/12/2013 0937   HGB 13.9 04/12/2013 0937   HCT 40.6 04/12/2013 0937   PLT 356 04/12/2013 0937   MCV 86.8 04/12/2013  0937   MCH 29.7 04/12/2013 0937   MCHC 34.2 04/12/2013 0937   RDW 14.1 04/12/2013 0937   Iron Studies No results found for: "IRON", "TIBC", "FERRITIN", "IRONPCTSAT" Lipid Panel     Component Value Date/Time   CHOL 209 (H) 06/26/2022 1242   TRIG 179 (H) 06/26/2022 1242   HDL 52 06/26/2022 1242   CHOLHDL 3.7 04/12/2013 0937   VLDL 25 04/12/2013 0937   LDLCALC 125 (H) 06/26/2022 1242   Hepatic Function Panel     Component Value Date/Time   PROT 7.3 06/26/2022 1242   ALBUMIN 4.6 06/26/2022 1242   AST 12 06/26/2022 1242   ALT 10 06/26/2022 1242   ALKPHOS 115 06/26/2022 1242   BILITOT 0.4 06/26/2022 1242      Component Value Date/Time   TSH 2.250 11/27/2021 1003    Nutritional Lab Results  Component Value Date   VD25OH 29.5 (L) 06/26/2022   VD25OH 27.1 (L) 11/27/2021     ASSESSMENT AND PLAN  TREATMENT PLAN FOR OBESITY:  Recommended Dietary Goals  Kayla Zimmerman is currently in the action stage of change. As such, her goal is to continue weight management plan. She has agreed to the Category 3 Plan.  Behavioral Intervention  We discussed the following Behavioral Modification Strategies today: increasing lean protein intake, decreasing simple carbohydrates , increasing vegetables, increasing lower glycemic fruits, increasing water intake, reading food labels , keeping healthy foods at home, continue to practice mindfulness when eating, and planning for success.  Additional resources provided today: NA  Recommended Physical Activity Goals  Kayla Zimmerman has been advised to work up to 150 minutes of moderate intensity aerobic activity a week and strengthening exercises 2-3 times per week for cardiovascular health, weight loss maintenance and preservation of muscle mass.   She has agreed to Continue current level of physical activity  and Increase physical activity in their day and reduce sedentary time (increase NEAT).   Pharmacotherapy We discussed various medication options to help Kayla Zimmerman with her weight loss efforts and we both agreed to continue [facility] 2.4mg .  side effects discussed.  ASSOCIATED CONDITIONS ADDRESSED TODAY  Action/Plan  Low vitamin B12 level Vit B12 # 2 injection given in office today.  Side effects discussed.   Morbid obesity (HCC) -     ZOXWRU; Inject 2.4 mg into the skin once a week.  Dispense: 3 mL; Refill: 0  BMI 36.0-36.9,adult -     EAVWUJ; Inject 2.4 mg into the skin once a week.  Dispense: 3 mL; Refill: 0         Return in about 4 weeks (around 11/05/2022).Marland Kitchen She was informed of the importance of frequent follow up visits to maximize her success with intensive lifestyle modifications for her multiple health  conditions.   ATTESTASTION STATEMENTS:  Reviewed by clinician on day of visit: allergies, medications, problem list, medical history, surgical history, family history, social history, and previous encounter notes.      Theodis Sato. Mahina Salatino FNP-C

## 2022-10-20 ENCOUNTER — Other Ambulatory Visit: Payer: Self-pay | Admitting: Nurse Practitioner

## 2022-10-20 DIAGNOSIS — E559 Vitamin D deficiency, unspecified: Secondary | ICD-10-CM

## 2022-10-24 ENCOUNTER — Encounter: Payer: Self-pay | Admitting: Nurse Practitioner

## 2022-10-24 ENCOUNTER — Other Ambulatory Visit: Payer: Self-pay | Admitting: Nurse Practitioner

## 2022-10-24 ENCOUNTER — Telehealth (INDEPENDENT_AMBULATORY_CARE_PROVIDER_SITE_OTHER): Payer: Self-pay | Admitting: Nurse Practitioner

## 2022-10-24 DIAGNOSIS — Z6836 Body mass index (BMI) 36.0-36.9, adult: Secondary | ICD-10-CM

## 2022-10-24 MED ORDER — WEGOVY 2.4 MG/0.75ML ~~LOC~~ SOAJ
2.4000 mg | SUBCUTANEOUS | 0 refills | Status: DC
Start: 2022-10-24 — End: 2022-12-31

## 2022-10-24 MED ORDER — WEGOVY 2.4 MG/0.75ML ~~LOC~~ SOAJ
2.4000 mg | SUBCUTANEOUS | 0 refills | Status: DC
Start: 2022-10-24 — End: 2022-10-24

## 2022-10-24 NOTE — Telephone Encounter (Signed)
Pt's husband called stating that he needs pt's Wegovy 2.4 moved to the pharmacy [facility] at [facility] in [place] and that it needs to be changed to a 90 Day supply for insurance to cover it. Please contact pt.

## 2022-11-12 ENCOUNTER — Ambulatory Visit: Payer: BC Managed Care – PPO | Admitting: Nurse Practitioner

## 2022-11-12 ENCOUNTER — Encounter: Payer: Self-pay | Admitting: Nurse Practitioner

## 2022-11-12 VITALS — BP 110/62 | HR 71 | Temp 98.2°F | Ht 63.0 in | Wt 199.0 lb

## 2022-11-12 DIAGNOSIS — Z6835 Body mass index (BMI) 35.0-35.9, adult: Secondary | ICD-10-CM | POA: Diagnosis not present

## 2022-11-12 DIAGNOSIS — R7989 Other specified abnormal findings of blood chemistry: Secondary | ICD-10-CM

## 2022-11-12 NOTE — Progress Notes (Signed)
Office: [phone]  /  Fax: [phone]  WEIGHT SUMMARY AND BIOMETRICS  Weight Lost Since Last Visit: 6lb  Weight Gained Since Last Visit: 0lb   Vitals Temp: 98.2 F (36.8 C) BP: 110/62 Pulse Rate: 71 SpO2: 97 %   Anthropometric Measurements Height: 5\' 3"  (1.6 m) Weight: 199 lb (90.3 kg) BMI (Calculated): 35.26 Weight at Last Visit: 205lb Weight Lost Since Last Visit: 6lb Weight Gained Since Last Visit: 0lb Starting Weight: 230lb Total Weight Loss (lbs): 31 lb (14.1 kg)   Body Composition  Body Fat %: 41.6 % Fat Mass (lbs): 83.2 lbs Muscle Mass (lbs): 110.8 lbs Total Body Water (lbs): 84 lbs Visceral Fat Rating : 10   Other Clinical Data Fasting: No Labs: No Today's Visit #: 13 Starting Date: 11/27/21     HPI  Chief Complaint: OBESITY  Kayla Zimmerman is here to discuss her progress with her obesity treatment plan. She is on the the Category 3 Plan and states she is following her eating plan approximately 90 % of the time. She states she is exercising 40-60 minutes 3 days per week.   Interval History:  Since last office visit she has lost 6 pounds.  The last time she was < 200 lbs was 6 years ago.  Has overall done well with weight loss.  Aiming to eat protein with each meal and is drinking water daily.    Next goal weight:  185 lbs   Pharmacotherapy for weight loss: She is currently taking Wegovy 2.4mg  for medical weight loss.  Denies side effects.    Prior Auth;Coverage Start Date:05/05/2022;Coverage End Date:12/31/2022;   Previous pharmacotherapy for medical weight loss:  Zepbound and Phentermine   She stopped Phentermine due to side effects of tachycardia (12/19/21)  She stopped Zepbound due to side effects of nausea, diarrhea and constipation.    Bariatric surgery:  Patient has not had bariatric surgery  Vit B12 def #3 Vit B12 dose given in office today  PHYSICAL EXAM:  Kayla Zimmerman pressure 110/62, pulse 71, temperature 98.2 F (36.8 C), height  5\' 3"  (1.6 m), weight 199 lb (90.3 kg), last menstrual period 10/20/2022, SpO2 97%. Body mass index is 35.25 kg/m.  General: She is overweight, cooperative, alert, well developed, and in no acute distress. PSYCH: Has normal mood, affect and thought process.   Extremities: No edema.  Neurologic: No gross sensory or motor deficits. No tremors or fasciculations noted.    DIAGNOSTIC DATA REVIEWED:  BMET    Component Value Date/Time   NA 139 06/26/2022 1242   K 4.4 06/26/2022 1242   CL 102 06/26/2022 1242   CO2 21 06/26/2022 1242   GLUCOSE 93 06/26/2022 1242   GLUCOSE 102 (H) 04/12/2013 0937   BUN 8 06/26/2022 1242   CREATININE 0.64 06/26/2022 1242   CREATININE 0.60 04/12/2013 0937   CALCIUM 10.1 06/26/2022 1242   GFRNONAA >89 04/12/2013 0937   GFRAA >89 04/12/2013 0937   Lab Results  Component Value Date   HGBA1C 5.7 (H) 06/26/2022   HGBA1C 6.0 (H) 11/27/2021   Lab Results  Component Value Date   INSULIN 33.5 (H) 06/26/2022   INSULIN 23.7 11/27/2021   Lab Results  Component Value Date   TSH 2.250 11/27/2021   CBC    Component Value Date/Time   WBC 7.7 04/12/2013 0937   RBC 4.68 04/12/2013 0937   HGB 13.9 04/12/2013 0937   HCT 40.6 04/12/2013 0937   PLT 356 04/12/2013 0937   MCV 86.8 04/12/2013 0937  MCH 29.7 04/12/2013 0937   MCHC 34.2 04/12/2013 0937   RDW 14.1 04/12/2013 0937   Iron Studies No results found for: "IRON", "TIBC", "FERRITIN", "IRONPCTSAT" Lipid Panel     Component Value Date/Time   CHOL 209 (H) 06/26/2022 1242   TRIG 179 (H) 06/26/2022 1242   HDL 52 06/26/2022 1242   CHOLHDL 3.7 04/12/2013 0937   VLDL 25 04/12/2013 0937   LDLCALC 125 (H) 06/26/2022 1242   Hepatic Function Panel     Component Value Date/Time   PROT 7.3 06/26/2022 1242   ALBUMIN 4.6 06/26/2022 1242   AST 12 06/26/2022 1242   ALT 10 06/26/2022 1242   ALKPHOS 115 06/26/2022 1242   BILITOT 0.4 06/26/2022 1242      Component Value Date/Time   TSH 2.250 11/27/2021  1003   Nutritional Lab Results  Component Value Date   VD25OH 29.5 (L) 06/26/2022   VD25OH 27.1 (L) 11/27/2021     ASSESSMENT AND PLAN  TREATMENT PLAN FOR OBESITY:  Recommended Dietary Goals  Kayla Zimmerman is currently in the action stage of change. As such, her goal is to continue weight management plan. She has agreed to the Category 3 Plan.  Behavioral Intervention  We discussed the following Behavioral Modification Strategies today: increasing lean protein intake to established goals, decreasing simple carbohydrates , increasing water intake , reading food labels , keeping healthy foods at home, and continue to work on maintaining a reduced calorie state, getting the recommended amount of protein, incorporating whole foods, making healthy choices, staying well hydrated and practicing mindfulness when eating..  Additional resources provided today: NA  Recommended Physical Activity Goals  Kayla Zimmerman has been advised to work up to 150 minutes of moderate intensity aerobic activity a week and strengthening exercises 2-3 times per week for cardiovascular health, weight loss maintenance and preservation of muscle mass.   She has agreed to Continue current level of physical activity    Pharmacotherapy We discussed various medication options to help Kayla Zimmerman with her weight loss efforts and we both agreed to continue [facility] 2.4mg .  Side effects discussed.  ASSOCIATED CONDITIONS ADDRESSED TODAY  Action/Plan  Low vitamin B12 level Doing well, reports more energy and feeling better  Morbid obesity (HCC)  BMI 35.0-35.9,adult         Return in about 4 weeks (around 12/10/2022).Marland Kitchen She was informed of the importance of frequent follow up visits to maximize her success with intensive lifestyle modifications for her multiple health conditions.   ATTESTASTION STATEMENTS:  Reviewed by clinician on day of visit: allergies, medications, problem list, medical history, surgical history, family  history, social history, and previous encounter notes.   Time spent on visit including pre-visit chart review and post-visit care and charting was 30 minutes.    Theodis Sato. Valgene Deloatch FNP-C

## 2022-11-20 ENCOUNTER — Encounter: Payer: Self-pay | Admitting: Family Medicine

## 2022-11-20 ENCOUNTER — Ambulatory Visit: Payer: BC Managed Care – PPO | Admitting: Family Medicine

## 2022-11-20 VITALS — BP 112/84 | HR 76 | Temp 97.7°F | Wt 203.2 lb

## 2022-11-20 DIAGNOSIS — M545 Low back pain, unspecified: Secondary | ICD-10-CM

## 2022-11-20 MED ORDER — NAPROXEN 500 MG PO TABS
500.0000 mg | ORAL_TABLET | Freq: Two times a day (BID) | ORAL | 0 refills | Status: AC | PRN
Start: 2022-11-20 — End: 2022-12-04

## 2022-11-20 MED ORDER — KETOROLAC TROMETHAMINE 60 MG/2ML IM SOLN
60.0000 mg | Freq: Once | INTRAMUSCULAR | Status: AC
Start: 2022-11-20 — End: 2022-11-20
  Administered 2022-11-20: 60 mg via INTRAMUSCULAR

## 2022-11-20 MED ORDER — CYCLOBENZAPRINE HCL 5 MG PO TABS
5.0000 mg | ORAL_TABLET | Freq: Three times a day (TID) | ORAL | 0 refills | Status: AC | PRN
Start: 2022-11-20 — End: 2022-12-04
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
## 2022-11-20 NOTE — Patient Instructions (Signed)
Take Naproxen 500 mg: Take one tablet twice a day with food to help manage your pain. Use Flexeril if Needed: Take 5 mg up to three times a day to help relax your muscles but watch for any drowsiness. Continue Heat and Cold Therapy: Alternate between heat and cold packs based on what feels better and helps reduce pain and swelling. Follow-Up Care: If you experience worsening pain, weakness, incontinence, numbness, fever, chills, or urinary issues, return for a follow-up. Most back pain should improve within 4 weeks. If not, schedule another visit.

## 2022-11-22 DIAGNOSIS — M545 Low back pain, unspecified: Secondary | ICD-10-CM | POA: Insufficient documentation

## 2022-11-22 NOTE — Assessment & Plan Note (Signed)
Acute new exacerbation on top of chronic pain.  Plan: Administer Ketorolac 60 mg IM in-office for immediate pain relief. Prescribe Naproxen 500 mg, to be taken twice daily with food. Prescribe Flexeril (Cyclobenzaprine) 5 mg, up to three times daily as needed for muscle spasm. Recommend continued use of heat and cold, Epsom salt baths, and yoga stretches. Provide a printout of home exercises aimed at back pain relief.

## 2022-11-29 ENCOUNTER — Encounter: Payer: Self-pay | Admitting: Nurse Practitioner

## 2022-11-29 ENCOUNTER — Ambulatory Visit (INDEPENDENT_AMBULATORY_CARE_PROVIDER_SITE_OTHER): Payer: BC Managed Care – PPO | Admitting: Nurse Practitioner

## 2022-11-29 VITALS — BP 116/82 | HR 71 | Temp 97.1°F | Ht 63.0 in | Wt 201.4 lb

## 2022-11-29 DIAGNOSIS — F321 Major depressive disorder, single episode, moderate: Secondary | ICD-10-CM

## 2022-11-29 DIAGNOSIS — G43829 Menstrual migraine, not intractable, without status migrainosus: Secondary | ICD-10-CM

## 2022-11-29 DIAGNOSIS — E559 Vitamin D deficiency, unspecified: Secondary | ICD-10-CM | POA: Diagnosis not present

## 2022-11-29 DIAGNOSIS — E782 Mixed hyperlipidemia: Secondary | ICD-10-CM

## 2022-11-29 DIAGNOSIS — Z0000 Encounter for general adult medical examination without abnormal findings: Secondary | ICD-10-CM | POA: Diagnosis not present

## 2022-11-29 DIAGNOSIS — E538 Deficiency of other specified B group vitamins: Secondary | ICD-10-CM | POA: Insufficient documentation

## 2022-11-29 DIAGNOSIS — R7303 Prediabetes: Secondary | ICD-10-CM

## 2022-11-29 MED ORDER — ESCITALOPRAM OXALATE 10 MG PO TABS: 10.0000 mg | ORAL_TABLET | Freq: Every day | ORAL | 3 refills | Status: DC

## 2022-11-29 NOTE — Assessment & Plan Note (Signed)
Continue supplement daily. She is going to have labs repeated in December.

## 2022-11-29 NOTE — Progress Notes (Signed)
BP 116/82 (BP Location: Left Arm)   Pulse 71   Temp (!) 97.1 F (36.2 C)   Ht 5\' 3"  (1.6 m)   Wt 201 lb 6.4 oz (91.4 kg)   LMP 11/14/2022 (Exact Date)   SpO2 97%   BMI 35.68 kg/m    Subjective:    Patient ID: Kayla Zimmerman, female    DOB: 10/22/1975, 47 y.o.   MRN: [id number]  CC: Chief Complaint  Patient presents with   Annual Exam    With fasting labs, no concerns    HPI: Kayla Zimmerman is a 47 y.o. female presenting on 11/29/2022 for comprehensive medical examination. Current medical complaints include:none  She currently lives with: husband Menopausal Symptoms: no  Depression and Anxiety Screen done today and results listed below:     11/29/2022    8:47 AM 01/15/2022   10:51 AM 11/27/2021    9:00 AM 11/21/2021    1:39 PM 10/24/2021   11:09 AM  Depression screen PHQ 2/9  Decreased Interest 0 0 3 1 2   Down, Depressed, Hopeless 0 0 3 1 2   PHQ - 2 Score 0 0 6 2 4   Altered sleeping 2  0 1 3  Tired, decreased energy 2  2 0 2  Change in appetite 0  2 0 1  Feeling bad or failure about yourself  0  3 1 2   Trouble concentrating 0  0 0 2  Moving slowly or fidgety/restless 0  0 0 0  Suicidal thoughts 0  3 0 0  PHQ-9 Score 4  16 4 14   Difficult doing work/chores Somewhat difficult  Somewhat difficult Somewhat difficult Very difficult      11/29/2022    8:47 AM 11/21/2021    1:39 PM 10/24/2021   11:09 AM  GAD 7 : Generalized Anxiety Score  Nervous, Anxious, on Edge 0 0 0  Control/stop worrying 0 0 0  Worry too much - different things 0 1 2  Trouble relaxing 0 1 0  Restless 0 0 0  Easily annoyed or irritable 1 1 2   Afraid - awful might happen 0 0 0  Total GAD 7 Score 1 3 4   Anxiety Difficulty Not difficult at all Somewhat difficult Not difficult at all    The patient does not have a history of falls. I did not complete a risk assessment for falls. A plan of care for falls was not documented.   Past Medical History:  Past Medical History:  Diagnosis Date    Arrhythmia 2002   Mitral valve prolapse   Back pain    Chronic back pain    Depression    Ganglion of joint    Migraines    MVP (mitral valve prolapse)     Surgical History:  Past Surgical History:  Procedure Laterality Date   WISDOM TOOTH EXTRACTION      Medications:  Current Outpatient Medications on File Prior to Visit  Medication Sig   B-D 3CC LUER-LOK SYR 25GX1" 25G X 1" 3 ML MISC    cyanocobalamin (VITAMIN B12) 1000 MCG/ML injection Inject 1 mL (1,000 mcg total) into the muscle every 30 (thirty) days.   cyclobenzaprine (FLEXERIL) 5 MG tablet Take 1 tablet (5 mg total) by mouth 3 (three) times daily as needed for up to 14 days for muscle spasms.   naproxen (NAPROSYN) 500 MG tablet Take 1 tablet (500 mg total) by mouth 2 (two) times daily as needed for up to 14 days (pain).  NEEDLE, DISP, 25 G (B-D DISP NEEDLE 25GX1") 25G X 1" MISC Take as directed with Vit B12   Semaglutide-Weight Management (WEGOVY) 2.4 MG/0.75ML SOAJ Inject 2.4 mg into the skin once a week.   Vitamin D, Ergocalciferol, (DRISDOL) 1.25 MG (50000 UNIT) CAPS capsule TAKE 1 CAPSULE BY MOUTH EVERY 7 DAYS   Multiple Vitamin (MULTI-VITAMINS) TABS Take 1 tablet by mouth daily. (Patient not taking: Reported on 11/20/2022)   ondansetron (ZOFRAN) 4 MG tablet Take 1 tablet (4 mg total) by mouth every 8 (eight) hours as needed for nausea or vomiting. (Patient not taking: Reported on 11/20/2022)   No current facility-administered medications on file prior to visit.    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   Tobacco Use   Smoking status: Never   Smokeless tobacco: Never  Vaping Use   Vaping status: Never Used  Substance and Sexual Activity   Alcohol use: Never   Drug use: Never   Sexual activity: Yes    Partners: Male    Birth control/protection: Surgical    Comment: husband vasectomy  Other Topics Concern   Not on file  Social History Narrative   Not on file   Social Determinants of Health   Financial Resource Strain: Not on file  Food Insecurity: Not on file  Transportation Needs: Not on file  Physical Activity: Not on file  Stress: Not on file  Social Connections: Not on file  Intimate Partner Violence: Not on file   Social History   Tobacco Use  Smoking Status Never  Smokeless Tobacco Never   Social History   Substance and Sexual Activity  Alcohol Use Never    Family History:  Family History  Problem Relation Age of Onset   Cancer Mother        leukemia   Mitral valve prolapse Mother    Arrhythmia Mother    Cancer Father        colon cancer   Breast cancer Maternal Aunt    Breast cancer Paternal Aunt    Mitral valve prolapse Maternal Grandfather    Arrhythmia Maternal Grandfather    Cancer Paternal Grandfather        skin, prostate   Diabetes Paternal Grandfather    Hypertension Paternal Grandfather     Past medical history, surgical history, medications, allergies, family history and social history reviewed with patient today and changes made to appropriate areas of the chart.   Review of Systems  Constitutional: Negative.   HENT: Negative.    Eyes: Negative.   Respiratory: Negative.    Cardiovascular: Negative.   Gastrointestinal: Negative.   Genitourinary: Negative.   Musculoskeletal:  Positive for back pain (improving).  Skin: Negative.   Neurological: Negative.   Psychiatric/Behavioral: Negative.     All other ROS negative except what is listed above and in the HPI.      Objective:    BP 116/82 (BP Location: Left Arm)   Pulse 71    Temp (!) 97.1 F (36.2 C)   Ht 5\' 3"  (1.6  m)   Wt 201 lb 6.4 oz (91.4 kg)   LMP 11/14/2022 (Exact Date)   SpO2 97%   BMI 35.68 kg/m   Wt Readings from Last 3 Encounters:  11/29/22 201 lb 6.4 oz (91.4 kg)  11/20/22 203 lb 3.2 oz (92.2 kg)  11/12/22 199 lb (90.3 kg)    Physical Exam Vitals and nursing note reviewed.  Constitutional:      General: She is not in acute distress.    Appearance: Normal appearance.  HENT:     Head: Normocephalic and atraumatic.     Right Ear: Tympanic membrane, ear canal and external ear normal.     Left Ear: Tympanic membrane, ear canal and external ear normal.     Mouth/Throat:     Mouth: Mucous membranes are moist.     Pharynx: No posterior oropharyngeal erythema.  Eyes:     Conjunctiva/sclera: Conjunctivae normal.  Cardiovascular:     Rate and Rhythm: Normal rate and regular rhythm.     Pulses: Normal pulses.     Heart sounds: Normal heart sounds.  Pulmonary:     Effort: Pulmonary effort is normal.     Breath sounds: Normal breath sounds.  Abdominal:     Palpations: Abdomen is soft.     Tenderness: There is no abdominal tenderness.  Musculoskeletal:        General: Normal range of motion.     Cervical back: Normal range of motion and neck supple.     Right lower leg: No edema.     Left lower leg: No edema.  Lymphadenopathy:     Cervical: No cervical adenopathy.  Skin:    General: Skin is warm and dry.  Neurological:     General: No focal deficit present.     Mental Status: She is alert and oriented to person, place, and time.     Cranial Nerves: No cranial nerve deficit.     Coordination: Coordination normal.     Gait: Gait normal.  Psychiatric:        Mood and Affect: Mood normal.        Behavior: Behavior normal.        Thought Content: Thought content normal.        Judgment: Judgment normal.     Results for orders placed or performed in visit on 06/26/22  Comprehensive metabolic panel  Result Value Ref Range   Glucose 93  70 - 99 mg/dL   BUN 8 6 - 24 mg/dL   Creatinine, Ser 1.61 0.57 - 1.00 mg/dL   eGFR 096 >04 VW/UJW/1.19   BUN/Creatinine Ratio 13 9 - 23   Sodium 139 134 - 144 mmol/L   Potassium 4.4 3.5 - 5.2 mmol/L   Chloride 102 96 - 106 mmol/L   CO2 21 20 - 29 mmol/L   Calcium 10.1 8.7 - 10.2 mg/dL   Total Protein 7.3 6.0 - 8.5 g/dL   Albumin 4.6 3.9 - 4.9 g/dL   Globulin, Total 2.7 1.5 - 4.5 g/dL   Albumin/Globulin Ratio 1.7 1.2 - 2.2   Bilirubin Total 0.4 0.0 - 1.2 mg/dL   Alkaline Phosphatase 115 44 - 121 IU/L   AST 12 0 - 40 IU/L   ALT 10 0 - 32 IU/L  Lipid Panel With LDL/HDL Ratio  Result Value Ref Range   Cholesterol, Total 209 (H) 100 - 199 mg/dL   Triglycerides 147 (H) 0 - 149 mg/dL   HDL 52 >82 mg/dL   VLDL Cholesterol Cal  32 5 - 40 mg/dL   LDL Chol Calc (NIH) 578 (H) 0 - 99 mg/dL   LDL/HDL Ratio 2.4 0.0 - 3.2 ratio  VITAMIN D 25 Hydroxy (Vit-D Deficiency, Fractures)  Result Value Ref Range   Vit D, 25-Hydroxy 29.5 (L) 30.0 - 100.0 ng/mL  Hemoglobin A1c  Result Value Ref Range   Hgb A1c MFr Bld 5.7 (H) 4.8 - 5.6 %   Est. average glucose Bld gHb Est-mCnc 117 mg/dL  Insulin, random  Result Value Ref Range   INSULIN 33.5 (H) 2.6 - 24.9 uIU/mL  Vitamin B12  Result Value Ref Range   Vitamin B-12 245 232 - 1,245 pg/mL      Assessment & Plan:   Problem List Items Addressed This Visit       Cardiovascular and Mediastinum   Menstrual migraine without status migrainosus, not intractable    Chronic, stable.  She takes advil migraine prn which helps.  Follow-up as symptoms worsen or with any concerns.      Relevant Medications   escitalopram (LEXAPRO) 10 MG tablet     Other   Morbid obesity (HCC)    BMI 35.6 associated with prediabetes and hyperlipidemia. She has lost 45 pounds in the last year, congratulated her on this! Continue Wegovy 2.4mg  injection weekly and following with [facility].       Depression, major, single episode, moderate (HCC)     Chronic, stable. Continue lexapro 10mg  daily. Follow-up in 1 year      Relevant Medications   escitalopram (LEXAPRO) 10 MG tablet   Vitamin D deficiency    Continue vitamin D supplement and is scheduled for repeat labs in December.       Prediabetes    Chronic, stable. Most recent A1c was 5.7%. Continue focus on nutrition and exercise. She is getting labs repeated in December.       Mixed hyperlipidemia    Chronic, stable. Cholesterol has improved with recent weight loss. Continue focus on nutrition and exercise.       Vitamin B 12 deficiency    Continue supplement daily. She is going to have labs repeated in December.       Routine general medical examination at a health care facility - Primary    Health maintenance reviewed and updated. Discussed nutrition, exercise. CMP and CBC reviewed from 06/2022. Follow-up 1 year.          Follow up plan: Return in about 1 year (around 11/29/2023) for CPE.   LABORATORY TESTING:  - Pap smear: up to date  IMMUNIZATIONS:   - Tdap: Tetanus vaccination status reviewed: last tetanus booster within 10 years. - Influenza:  declined - Pneumovax: Not applicable - Prevnar: Not applicable - HPV: Not applicable - Shingrix vaccine: Not applicable  SCREENING: -Mammogram: Up to date  - Colonoscopy: Not applicable  - Bone Density: Not applicable   PATIENT COUNSELING:   Advised to take 1 mg of folate supplement per day if capable of pregnancy.   Sexuality: Discussed sexually transmitted diseases, partner selection, use of condoms, avoidance of unintended pregnancy  and contraceptive alternatives.   Advised to avoid cigarette smoking.  I discussed with the patient that most people either abstain from alcohol or drink within safe limits (<=14/week and <=4 drinks/occasion for males, <=7/weeks and <= 3 drinks/occasion for females) and that the risk for alcohol disorders and other health effects rises proportionally with the number of drinks per  week and how often a drinker exceeds daily  limits.  Discussed cessation/primary prevention of drug use and availability of treatment for abuse.   Diet: Encouraged to adjust caloric intake to maintain  or achieve ideal body weight, to reduce intake of dietary saturated fat and total fat, to limit sodium intake by avoiding high sodium foods and not adding table salt, and to maintain adequate dietary potassium and calcium preferably from fresh fruits, vegetables, and low-fat dairy products.    stressed the importance of regular exercise  Injury prevention: Discussed safety belts, safety helmets, smoke detector, smoking near bedding or upholstery.   Dental health: Discussed importance of regular tooth brushing, flossing, and dental visits.    NEXT PREVENTATIVE PHYSICAL DUE IN 1 YEAR. Return in about 1 year (around 11/29/2023) for CPE.  Willine Schwalbe A Tony Friscia

## 2022-11-29 NOTE — Assessment & Plan Note (Signed)
Chronic, stable. Most recent A1c was 5.7%. Continue focus on nutrition and exercise. She is getting labs repeated in December.

## 2022-11-29 NOTE — Assessment & Plan Note (Signed)
Health maintenance reviewed and updated. Discussed nutrition, exercise. CMP and CBC reviewed from 06/2022. Follow-up 1 year.

## 2022-11-29 NOTE — Assessment & Plan Note (Signed)
BMI 35.6 associated with prediabetes and hyperlipidemia. She has lost 45 pounds in the last year, congratulated her on this! Continue Wegovy 2.4mg  injection weekly and following with [facility].

## 2022-11-29 NOTE — Assessment & Plan Note (Signed)
Chronic, stable.  She takes advil migraine prn which helps.  Follow-up as symptoms worsen or with any concerns.

## 2022-11-29 NOTE — Patient Instructions (Signed)
It was great to see you!  Keep up the great work!   Let's follow-up in 1 year, sooner if you have concerns.  If a referral was placed today, you will be contacted for an appointment. Please note that routine referrals can sometimes take up to 3-4 weeks to process. Please call our office if you haven't heard anything after this time frame.  Take care,  Adelise Buswell, NP  

## 2022-11-29 NOTE — Assessment & Plan Note (Signed)
Chronic, stable. Continue lexapro 10mg  daily. Follow-up in 1 year

## 2022-11-29 NOTE — Assessment & Plan Note (Signed)
Chronic, stable. Cholesterol has improved with recent weight loss. Continue focus on nutrition and exercise.

## 2022-11-29 NOTE — Assessment & Plan Note (Signed)
Continue vitamin D supplement and is scheduled for repeat labs in December.

## 2022-12-09 ENCOUNTER — Ambulatory Visit: Payer: BC Managed Care – PPO | Admitting: Bariatrics

## 2022-12-10 ENCOUNTER — Ambulatory Visit: Payer: BC Managed Care – PPO | Admitting: Nurse Practitioner

## 2022-12-11 ENCOUNTER — Encounter: Payer: Self-pay | Admitting: Bariatrics

## 2022-12-11 ENCOUNTER — Ambulatory Visit: Payer: BC Managed Care – PPO | Admitting: Bariatrics

## 2022-12-11 VITALS — BP 133/84 | HR 79 | Temp 98.5°F | Ht 63.0 in | Wt 196.0 lb

## 2022-12-11 DIAGNOSIS — E785 Hyperlipidemia, unspecified: Secondary | ICD-10-CM

## 2022-12-11 DIAGNOSIS — E559 Vitamin D deficiency, unspecified: Secondary | ICD-10-CM | POA: Diagnosis not present

## 2022-12-11 DIAGNOSIS — R7303 Prediabetes: Secondary | ICD-10-CM | POA: Diagnosis not present

## 2022-12-11 DIAGNOSIS — E538 Deficiency of other specified B group vitamins: Secondary | ICD-10-CM

## 2022-12-11 DIAGNOSIS — Z6834 Body mass index (BMI) 34.0-34.9, adult: Secondary | ICD-10-CM

## 2022-12-11 DIAGNOSIS — E669 Obesity, unspecified: Secondary | ICD-10-CM

## 2022-12-11 DIAGNOSIS — E6609 Other obesity due to excess calories: Secondary | ICD-10-CM

## 2022-12-11 DIAGNOSIS — E782 Mixed hyperlipidemia: Secondary | ICD-10-CM

## 2022-12-11 NOTE — Progress Notes (Signed)
WEIGHT SUMMARY AND BIOMETRICS  Weight Lost Since Last Visit: 3lb  Weight Gained Since Last Visit: 0   Vitals Temp: 98.5 F (36.9 C) BP: 133/84 Pulse Rate: 79 SpO2: 98 %   Anthropometric Measurements Height: 5\' 3"  (1.6 m) Weight: 196 lb (88.9 kg) BMI (Calculated): 34.73 Weight at Last Visit: 199lb Weight Lost Since Last Visit: 3lb Weight Gained Since Last Visit: 0 Starting Weight: 199lb Total Weight Loss (lbs): 34 lb (15.4 kg)   Body Composition  Body Fat %: 41.5 % Fat Mass (lbs): 81.6 lbs Muscle Mass (lbs): 109.2 lbs Total Body Water (lbs): 83 lbs Visceral Fat Rating : 10   Other Clinical Data Fasting: no Labs: no Today's Visit #: 14 Starting Date: 11/27/21    OBESITY Siarah is here to discuss her progress with her obesity treatment plan along with follow-up of her obesity related diagnoses.    Nutrition Plan: the Category 3 plan - 90% adherence.  Current exercise: running/ jogging and walking  Interim History:  She is down 3 lbs since her last visit.  Eating all of the food on the plan., Protein intake is as prescribed, Is not skipping meals, Water intake is adequate., and Denies polyphagia  Hunger is moderately controlled.  Cravings are moderately controlled.  Assessment/Plan:   1. Vitamin D deficiency Vitamin D Deficiency Vitamin D is at goal of 50.  Most recent vitamin D level was 29.5. She is on  prescription ergocalciferol 50,000 IU weekly. Lab Results  Component Value Date   VD25OH 29.5 (L) 06/26/2022   VD25OH 27.1 (L) 11/27/2021    Plan: Refill prescription vitamin D 50,000 IU weekly.    2. B 12 deficiency:   She is taking B12 (injection). She has a history of B12 deficiency and her last B 12 was 245.   Plan:  Check B 12 lab at her next visit.  Continue B 12 injections. B12 injection given today.   Hyperlipidemia:    She is not taking any medications   Plan: Will check lipids. Continue diet and exercise.   Prediabetes:   She will need labs in the near future . Her HgbA1c was 5.7 at the last lab draw. She is taking medications as directed.   Plan: Will check HgbA1c and insulin at her next visit. .    Generalized Obesity: Current BMI BMI (Calculated): 34.73   Pharmacotherapy Plan Continue  Wegovy 2.4 mg SQ weekly  Carmon is currently in the action stage of change. As such, her goal is to continue with weight loss efforts.  She has agreed to the Category 3 plan.  Exercise goals: For substantial health benefits, adults should do at least 150 minutes (2 hours and 30 minutes) a week of moderate-intensity, or 75 minutes (1 hour and 15 minutes) a week of vigorous-intensity aerobic physical activity, or an equivalent combination of moderate- and vigorous-intensity aerobic  activity. Aerobic activity should be performed in episodes of at least 10 minutes, and preferably, it should be spread throughout the week. She has started to walk and has signed up a 5 K in the spring.   Behavioral modification strategies: increasing lean protein intake, decreasing simple carbohydrates , no meal skipping, meal planning , increase water intake, better snacking choices, planning for success, increasing vegetables, get rid of junk food in the home, decrease snacking , avoiding temptations, keep healthy foods in the home, and mindful eating.  Lennox has agreed to follow-up with our clinic in 4 weeks.      Objective:   VITALS: Per patient if applicable, see vitals. GENERAL: Alert and in no acute distress. CARDIOPULMONARY: No increased WOB. Speaking in clear sentences.  PSYCH: Pleasant and cooperative. Speech normal rate and rhythm. Affect is appropriate. Insight and judgement are appropriate. Attention is focused, linear, and appropriate.  NEURO: Oriented as arrived to appointment on time with no prompting.   Attestation  Statements:    This was prepared with the assistance of Engineer, civil (consulting).  Occasional wrong-word or sound-a-like substitutions may have occurred due to the inherent limitations of voice recognition   Corinna Capra, DO

## 2022-12-23 ENCOUNTER — Ambulatory Visit: Payer: BC Managed Care – PPO | Admitting: Nurse Practitioner

## 2022-12-31 ENCOUNTER — Ambulatory Visit: Payer: BC Managed Care – PPO | Admitting: Nurse Practitioner

## 2022-12-31 ENCOUNTER — Encounter: Payer: Self-pay | Admitting: Nurse Practitioner

## 2022-12-31 ENCOUNTER — Telehealth: Payer: Self-pay

## 2022-12-31 VITALS — BP 117/72 | HR 70 | Temp 98.5°F | Ht 63.0 in | Wt 194.0 lb

## 2022-12-31 DIAGNOSIS — Z6836 Body mass index (BMI) 36.0-36.9, adult: Secondary | ICD-10-CM | POA: Diagnosis not present

## 2022-12-31 DIAGNOSIS — R7303 Prediabetes: Secondary | ICD-10-CM | POA: Diagnosis not present

## 2022-12-31 DIAGNOSIS — R7989 Other specified abnormal findings of blood chemistry: Secondary | ICD-10-CM | POA: Diagnosis not present

## 2022-12-31 MED ORDER — WEGOVY 2.4 MG/0.75ML ~~LOC~~ SOAJ: 2.4000 mg | SUBCUTANEOUS | 0 refills | Status: DC

## 2022-12-31 NOTE — Telephone Encounter (Signed)
PA submitted through Cover My Meds for Wegvoy 2.4 mg. Awaiting insurance determination. Key: [id number]

## 2022-12-31 NOTE — Progress Notes (Signed)
Office: [phone]  /  Fax: [phone]  WEIGHT SUMMARY AND BIOMETRICS  Weight Lost Since Last Visit: 2 lb  No data recorded  Vitals Temp: 98.5 F (36.9 C) BP: Zimmerman Pulse Rate: 70 SpO2: 95 %   Anthropometric Measurements Height: 5\' 3"  (1.6 m) Weight: 194 lb (88 kg) BMI (Calculated): 34.37 Weight at Last Visit: 196 lb Weight Lost Since Last Visit: 2 lb Starting Weight: 230 lb Total Weight Loss (lbs): 36 lb (16.3 kg)   Body Composition  Body Fat %: 40.7 % Fat Mass (lbs): 79 lbs Muscle Mass (lbs): 109.2 lbs Total Body Water (lbs): 82.2 lbs Visceral Fat Rating : 10   Other Clinical Data Fasting: no Labs: no Today's Visit #: 15 Starting Date: 11/28/22     HPI  Chief Complaint: OBESITY  Kayla Zimmerman is here to discuss her progress with her obesity treatment plan. She is on the the Category 3 Plan and states she is following her eating plan approximately 90 % of the time. She states she is exercising 30 minutes 5  days per week.   Interval History:  Since last office visit she has lost 2 pounds. She has overall done well with weight loss.  She has lost 10% of her body weight since starting Wegovy.  Denies polyphagia or cravings with GNFAOZ.    Next goal weight:  185 lbs     Pharmacotherapy for weight loss: She is currently taking Wegovy 2.4mg  for medical weight loss.  Denies side effects.     Prior Auth;Coverage Start Date:05/05/2022;Coverage End Date:12/31/2022;    Previous pharmacotherapy for medical weight loss:  Zepbound and Phentermine   She stopped Phentermine due to side effects of tachycardia (12/19/21)  She stopped Zepbound due to side effects of nausea, diarrhea and constipation.     Bariatric surgery:  Patient has not had bariatric surgery   Prediabetes Last A1c was 5.7  Medication(s): Wegovy 2.4 mg SQ weekly.  Denies side effects Polyphagia:Yes Lab Results  Component Value Date   HGBA1C 5.7 (H) 06/26/2022   HGBA1C 6.0 (H) 11/27/2021    Lab Results  Component Value Date   INSULIN 33.5 (H) 06/26/2022   INSULIN 23.7 11/27/2021      PHYSICAL EXAM:  Kayla Zimmerman, pulse 70, temperature 98.5 F (36.9 C), height 5\' 3"  (1.6 m), weight 194 lb (88 kg), last menstrual period 12/15/2022, SpO2 95%. Body mass index is 34.37 kg/m.  General: She is overweight, cooperative, alert, well developed, and in no acute distress. PSYCH: Has normal mood, affect and thought process.   Extremities: No edema.  Neurologic: No gross sensory or motor deficits. No tremors or fasciculations noted.    DIAGNOSTIC DATA REVIEWED:  BMET    Component Value Date/Time   NA 139 06/26/2022 1242   K 4.4 06/26/2022 1242   CL 102 06/26/2022 1242   CO2 21 06/26/2022 1242   GLUCOSE 93 06/26/2022 1242   GLUCOSE 102 (H) 04/12/2013 0937   BUN 8 06/26/2022 1242   CREATININE 0.64 06/26/2022 1242   CREATININE 0.60 04/12/2013 0937   CALCIUM 10.1 06/26/2022 1242   GFRNONAA >89 04/12/2013 0937   GFRAA >89 04/12/2013 0937   Lab Results  Component Value Date   HGBA1C 5.7 (H) 06/26/2022   HGBA1C 6.0 (H) 11/27/2021   Lab Results  Component Value Date   INSULIN 33.5 (H) 06/26/2022   INSULIN 23.7 11/27/2021   Lab Results  Component Value Date   TSH 2.250 11/27/2021   CBC  Component Value Date/Time   WBC 7.7 04/12/2013 0937   RBC 4.68 04/12/2013 0937   HGB 13.9 04/12/2013 0937   HCT 40.6 04/12/2013 0937   PLT 356 04/12/2013 0937   MCV 86.8 04/12/2013 0937   MCH 29.7 04/12/2013 0937   MCHC 34.2 04/12/2013 0937   RDW 14.1 04/12/2013 0937   Iron Studies No results found for: "IRON", "TIBC", "FERRITIN", "IRONPCTSAT" Lipid Panel     Component Value Date/Time   CHOL 209 (H) 06/26/2022 1242   TRIG 179 (H) 06/26/2022 1242   HDL 52 06/26/2022 1242   CHOLHDL 3.7 04/12/2013 0937   VLDL 25 04/12/2013 0937   LDLCALC 125 (H) 06/26/2022 1242   Hepatic Function Panel     Component Value Date/Time   PROT 7.3 06/26/2022 1242   ALBUMIN  4.6 06/26/2022 1242   AST 12 06/26/2022 1242   ALT 10 06/26/2022 1242   ALKPHOS 115 06/26/2022 1242   BILITOT 0.4 06/26/2022 1242      Component Value Date/Time   TSH 2.250 11/27/2021 1003   Nutritional Lab Results  Component Value Date   VD25OH 29.5 (L) 06/26/2022   VD25OH 27.1 (L) 11/27/2021     ASSESSMENT AND PLAN  TREATMENT PLAN FOR OBESITY:  Recommended Dietary Goals  Kayla Zimmerman is currently in the action stage of change. As such, her goal is to continue weight management plan. She has agreed to the Category 3 Plan.  Behavioral Intervention  We discussed the following Behavioral Modification Strategies today: continue to work on maintaining a reduced calorie state, getting the recommended amount of protein, incorporating whole foods, making healthy choices, staying well hydrated and practicing mindfulness when eating..  Additional resources provided today: NA  Recommended Physical Activity Goals  Kayla Zimmerman has been advised to work up to 150 minutes of moderate intensity aerobic activity a week and strengthening exercises 2-3 times per week for cardiovascular health, weight loss maintenance and preservation of muscle mass.   She has agreed to Continue current level of physical activity , Think about enjoyable ways to increase daily physical activity and overcoming barriers to exercise, and Increase physical activity in their day and reduce sedentary time (increase NEAT).   Pharmacotherapy We discussed various medication options to help Kayla Zimmerman with her weight loss efforts and we both agreed to continue [facility] 2.4mg .  side effects discussed.  ASSOCIATED CONDITIONS ADDRESSED TODAY  Action/Plan  Prediabetes Continue Wegovy 2.4mg .  Will obtain labs at next visit  Low vitamin B12 level # 5 Vit B12 injection given today  Morbid obesity (HCC) -     KZSWFU; Inject 2.4 mg into the skin once a week.  Dispense: 9 mL; Refill: 0  BMI 36.0-36.9,adult     Patient is not fasting,  will obtain labs at next visit.     Return in about 4 weeks (around 01/28/2023).Marland Kitchen She was informed of the importance of frequent follow up visits to maximize her success with intensive lifestyle modifications for her multiple health conditions.   ATTESTASTION STATEMENTS:  Reviewed by clinician on day of visit: allergies, medications, problem list, medical history, surgical history, family history, social history, and previous encounter notes.    Theodis Sato. Nkosi Cortright FNP-C

## 2022-12-31 NOTE — Telephone Encounter (Signed)
Per Cover My Meds:  Message from plan: CaseId:[id number];Status:Approved;Review Type:Prior Auth;Coverage Start Date:12/01/2022;Coverage End Date:12/31/2023;. Authorization Expiration Date: December 31, 2023.

## 2023-01-10 ENCOUNTER — Other Ambulatory Visit: Payer: Self-pay | Admitting: Nurse Practitioner

## 2023-01-10 DIAGNOSIS — E559 Vitamin D deficiency, unspecified: Secondary | ICD-10-CM

## 2023-01-13 ENCOUNTER — Other Ambulatory Visit: Payer: Self-pay | Admitting: Nurse Practitioner

## 2023-01-13 DIAGNOSIS — E559 Vitamin D deficiency, unspecified: Secondary | ICD-10-CM

## 2023-01-27 ENCOUNTER — Encounter: Payer: Self-pay | Admitting: Nurse Practitioner

## 2023-01-27 ENCOUNTER — Ambulatory Visit: Payer: BC Managed Care – PPO | Admitting: Nurse Practitioner

## 2023-01-27 VITALS — BP 109/72 | HR 79 | Temp 98.2°F | Ht 63.0 in | Wt 192.0 lb

## 2023-01-27 DIAGNOSIS — R7309 Other abnormal glucose: Secondary | ICD-10-CM | POA: Diagnosis not present

## 2023-01-27 DIAGNOSIS — R7989 Other specified abnormal findings of blood chemistry: Secondary | ICD-10-CM | POA: Diagnosis not present

## 2023-01-27 DIAGNOSIS — R7303 Prediabetes: Secondary | ICD-10-CM | POA: Diagnosis not present

## 2023-01-27 DIAGNOSIS — Z6834 Body mass index (BMI) 34.0-34.9, adult: Secondary | ICD-10-CM

## 2023-01-27 DIAGNOSIS — E559 Vitamin D deficiency, unspecified: Secondary | ICD-10-CM | POA: Diagnosis not present

## 2023-01-27 DIAGNOSIS — E786 Lipoprotein deficiency: Secondary | ICD-10-CM | POA: Diagnosis not present

## 2023-01-27 DIAGNOSIS — Z79899 Other long term (current) drug therapy: Secondary | ICD-10-CM

## 2023-01-27 DIAGNOSIS — E538 Deficiency of other specified B group vitamins: Secondary | ICD-10-CM | POA: Diagnosis not present

## 2023-01-27 DIAGNOSIS — E782 Mixed hyperlipidemia: Secondary | ICD-10-CM | POA: Diagnosis not present

## 2023-01-27 DIAGNOSIS — E669 Obesity, unspecified: Secondary | ICD-10-CM

## 2023-01-27 MED ORDER — CYANOCOBALAMIN 1000 MCG/ML IJ SOLN: 1000.0000 ug | INTRAMUSCULAR | 4 refills | Status: DC

## 2023-01-27 MED ORDER — VITAMIN D (ERGOCALCIFEROL) 1.25 MG (50000 UNIT) PO CAPS: 50000.0000 [IU] | ORAL_CAPSULE | ORAL | 0 refills | Status: DC

## 2023-01-27 NOTE — Progress Notes (Signed)
Office: [phone]  /  Fax: [phone]  WEIGHT SUMMARY AND BIOMETRICS  Weight Lost Since Last Visit: 2lb  Weight Gained Since Last Visit: 0lb   Vitals Temp: 98.2 F (36.8 C) BP: 109/72 Pulse Rate: 79 SpO2: 100 %   Anthropometric Measurements Height: 5\' 3"  (1.6 m) Weight: 192 lb (87.1 kg) BMI (Calculated): 34.02 Weight at Last Visit: 194lb Weight Lost Since Last Visit: 2lb Weight Gained Since Last Visit: 0lb Starting Weight: 230lb Total Weight Loss (lbs): 38 lb (17.2 kg)   Body Composition  Body Fat %: 40 % Fat Mass (lbs): 77 lbs Muscle Mass (lbs): 109.8 lbs Total Body Water (lbs): 79.8 lbs Visceral Fat Rating : 10   Other Clinical Data Fasting: Yes Labs: No Today's Visit #: 16 Starting Date: 11/28/22     HPI  Chief Complaint: OBESITY  Kayla Zimmerman is here to discuss her progress with her obesity treatment plan. She is on the the Category 3 Plan and states she is following her eating plan approximately 90 % of the time. She states she is exercising 30 minutes 5 days per week.   Interval History:  Since last office visit she has lost 2 pounds.  She went to [employer] last week.  She is not skipping meals, eating a protein with each meal and is drinking water daily.   Pharmacotherapy for weight loss: She is currently taking Wegovy 2.4mg  for medical weight loss.  Denies side effects.     Message from plan: CaseId:[id number];Status:Approved;Review Type:Prior Auth;Coverage Start Date:12/01/2022;Coverage End Date:12/31/2023;. Authorization Expiration Date: December 31, 2023.     Previous pharmacotherapy for medical weight loss:  Zepbound and Phentermine   She stopped Phentermine due to side effects of tachycardia (12/19/21)  She stopped Zepbound due to side effects of nausea, diarrhea and constipation.     Bariatric surgery:  Patient has not had bariatric surgery    Prediabetes Last A1c was 5.7  Medication(s): Wegovy 2.4 mg SQ  weekly Polyphagia:Yes Lab Results  Component Value Date   HGBA1C 5.7 (H) 06/26/2022   HGBA1C 6.0 (H) 11/27/2021   Lab Results  Component Value Date   INSULIN 33.5 (H) 06/26/2022   INSULIN 23.7 11/27/2021   Vit D deficiency  She is taking Vit D 50,000 IU weekly.  Denies side effects.  Denies nausea, vomiting or muscle weakness.    Lab Results  Component Value Date   VD25OH 29.5 (L) 06/26/2022   VD25OH 27.1 (L) 11/27/2021    Vit B12 def Taking Vit B12 monthly x 4 doses. Denies side effects.    Hyperlipidemia Medication(s): never been on meds.    Cardiovascular risk factors: dyslipidemia and obesity (BMI >= 30 kg/m2)  Lab Results  Component Value Date   CHOL 209 (H) 06/26/2022   HDL 52 06/26/2022   LDLCALC 125 (H) 06/26/2022   TRIG 179 (H) 06/26/2022   CHOLHDL 3.7 04/12/2013   Lab Results  Component Value Date   ALT 10 06/26/2022   AST 12 06/26/2022   ALKPHOS 115 06/26/2022   BILITOT 0.4 06/26/2022   The 10-year ASCVD risk score (Arnett DK, et al., 2019) is: 0.8%   Values used to calculate the score:     Age: 47 years     Sex: Female     Is Non-Hispanic African American: No     Diabetic: No     Tobacco smoker: No     Systolic Bagot Pressure: 109 mmHg     Is BP treated: No  HDL Cholesterol: 52 mg/dL     Total Cholesterol: 209 mg/dL   PHYSICAL EXAM:  [name] pressure 109/72, pulse 79, temperature 98.2 F (36.8 C), height 5\' 3"  (1.6 m), weight 192 lb (87.1 kg), last menstrual period 12/15/2022, SpO2 100%. Body mass index is 34.01 kg/m.  General: She is overweight, cooperative, alert, well developed, and in no acute distress. PSYCH: Has normal mood, affect and thought process.   Extremities: No edema.  Neurologic: No gross sensory or motor deficits. No tremors or fasciculations noted.    DIAGNOSTIC DATA REVIEWED:  BMET    Component Value Date/Time   NA 139 06/26/2022 1242   K 4.4 06/26/2022 1242   CL 102 06/26/2022 1242   CO2 21 06/26/2022 1242    GLUCOSE 93 06/26/2022 1242   GLUCOSE 102 (H) 04/12/2013 0937   BUN 8 06/26/2022 1242   CREATININE 0.64 06/26/2022 1242   CREATININE 0.60 04/12/2013 0937   CALCIUM 10.1 06/26/2022 1242   GFRNONAA >89 04/12/2013 0937   GFRAA >89 04/12/2013 0937   Lab Results  Component Value Date   HGBA1C 5.7 (H) 06/26/2022   HGBA1C 6.0 (H) 11/27/2021   Lab Results  Component Value Date   INSULIN 33.5 (H) 06/26/2022   INSULIN 23.7 11/27/2021   Lab Results  Component Value Date   TSH 2.250 11/27/2021   CBC    Component Value Date/Time   WBC 7.7 04/12/2013 0937   RBC 4.68 04/12/2013 0937   HGB 13.9 04/12/2013 0937   HCT 40.6 04/12/2013 0937   PLT 356 04/12/2013 0937   MCV 86.8 04/12/2013 0937   MCH 29.7 04/12/2013 0937   MCHC 34.2 04/12/2013 0937   RDW 14.1 04/12/2013 0937   Iron Studies No results found for: "IRON", "TIBC", "FERRITIN", "IRONPCTSAT" Lipid Panel     Component Value Date/Time   CHOL 209 (H) 06/26/2022 1242   TRIG 179 (H) 06/26/2022 1242   HDL 52 06/26/2022 1242   CHOLHDL 3.7 04/12/2013 0937   VLDL 25 04/12/2013 0937   LDLCALC 125 (H) 06/26/2022 1242   Hepatic Function Panel     Component Value Date/Time   PROT 7.3 06/26/2022 1242   ALBUMIN 4.6 06/26/2022 1242   AST 12 06/26/2022 1242   ALT 10 06/26/2022 1242   ALKPHOS 115 06/26/2022 1242   BILITOT 0.4 06/26/2022 1242      Component Value Date/Time   TSH 2.250 11/27/2021 1003   Nutritional Lab Results  Component Value Date   VD25OH 29.5 (L) 06/26/2022   VD25OH 27.1 (L) 11/27/2021     ASSESSMENT AND PLAN  TREATMENT PLAN FOR OBESITY:  Recommended Dietary Goals  Kayla Zimmerman is currently in the action stage of change. As such, her goal is to continue weight management plan. She has agreed to the Category 3 Plan.  Behavioral Intervention  We discussed the following Behavioral Modification Strategies today: celebration eating strategies and continue to work on maintaining a reduced calorie state, getting  the recommended amount of protein, incorporating whole foods, making healthy choices, staying well hydrated and practicing mindfulness when eating..  Additional resources provided today: NA  Recommended Physical Activity Goals  Kayla Zimmerman has been advised to work up to 150 minutes of moderate intensity aerobic activity a week and strengthening exercises 2-3 times per week for cardiovascular health, weight loss maintenance and preservation of muscle mass.   She has agreed to Increase the intensity, frequency or duration of strengthening exercises  and Increase the intensity, frequency or duration of aerobic exercises  Pharmacotherapy We discussed various medication options to help Nunziata with her weight loss efforts and we both agreed to continue [facility] 2.4mg . Side effects discussed.  ASSOCIATED CONDITIONS ADDRESSED TODAY  Action/Plan  Prediabetes -     Hemoglobin A1c -     Insulin, random  Vitamin D deficiency -     VITAMIN D 25 Hydroxy (Vit-D Deficiency, Fractures) -     Vitamin D (Ergocalciferol); Take 1 capsule (50,000 Units total) by mouth every 7 (seven) days.  Dispense: 12 capsule; Refill: 0  Low vitamin B12 level -     Vitamin B12 -     Cyanocobalamin; Inject 1 mL (1,000 mcg total) into the muscle every 30 (thirty) days.  Dispense: 1 mL; Refill: 4  Mixed hyperlipidemia -     Lipid Panel With LDL/HDL Ratio  Medication management -     Comprehensive metabolic panel -     TSH -     CBC with Differential/Platelet  Generalized obesity -     TSH  BMI 34.0-34.9,adult -     TSH         Return in about 4 weeks (around 02/24/2023).Marland Kitchen She was informed of the importance of frequent follow up visits to maximize her success with intensive lifestyle modifications for her multiple health conditions.   ATTESTASTION STATEMENTS:  Reviewed by clinician on day of visit: allergies, medications, problem list, medical history, surgical history, family history, social history, and  previous encounter notes.     Theodis Sato. Brelynn Wheller FNP-C

## 2023-01-28 ENCOUNTER — Ambulatory Visit: Payer: BC Managed Care – PPO | Admitting: Nurse Practitioner

## 2023-01-28 LAB — CBC WITH DIFFERENTIAL/PLATELET
Basophils Absolute: 0.1 10*3/uL (ref 0.0–0.2)
Basos: 1 %
EOS (ABSOLUTE): 0.4 10*3/uL (ref 0.0–0.4)
Eos: 4 %
Hematocrit: 42.5 % (ref 34.0–46.6)
Hemoglobin: 13.6 g/dL (ref 11.1–15.9)
Immature Grans (Abs): 0 10*3/uL (ref 0.0–0.1)
Immature Granulocytes: 0 %
Lymphocytes Absolute: 2.3 10*3/uL (ref 0.7–3.1)
Lymphs: 24 %
MCH: 27.4 pg (ref 26.6–33.0)
MCHC: 32 g/dL (ref 31.5–35.7)
MCV: 86 fL (ref 79–97)
Monocytes Absolute: 0.4 10*3/uL (ref 0.1–0.9)
Monocytes: 4 %
Neutrophils Absolute: 6.5 10*3/uL (ref 1.4–7.0)
Neutrophils: 67 %
Platelets: 432 10*3/uL (ref 150–450)
RBC: 4.96 x10E6/uL (ref 3.77–5.28)
RDW: 14 % (ref 11.7–15.4)
WBC: 9.8 10*3/uL (ref 3.4–10.8)

## 2023-01-28 LAB — COMPREHENSIVE METABOLIC PANEL
ALT: 14 [IU]/L (ref 0–32)
AST: 15 [IU]/L (ref 0–40)
Albumin: 4.4 g/dL (ref 3.9–4.9)
Alkaline Phosphatase: 113 [IU]/L (ref 44–121)
BUN/Creatinine Ratio: 10 (ref 9–23)
BUN: 7 mg/dL (ref 6–24)
Bilirubin Total: 0.2 mg/dL (ref 0.0–1.2)
CO2: 23 mmol/L (ref 20–29)
Calcium: 9.7 mg/dL (ref 8.7–10.2)
Chloride: 101 mmol/L (ref 96–106)
Creatinine, Ser: 0.73 mg/dL (ref 0.57–1.00)
Globulin, Total: 2.6 g/dL (ref 1.5–4.5)
Glucose: 94 mg/dL (ref 70–99)
Potassium: 5 mmol/L (ref 3.5–5.2)
Sodium: 139 mmol/L (ref 134–144)
Total Protein: 7 g/dL (ref 6.0–8.5)
eGFR: 103 mL/min/{1.73_m2} (ref >59)

## 2023-01-28 LAB — LIPID PANEL WITH LDL/HDL RATIO
Cholesterol, Total: 196 mg/dL (ref 100–199)
HDL: 49 mg/dL (ref >39)
LDL Chol Calc (NIH): 105 mg/dL — ABNORMAL HIGH (ref 0–99)
LDL/HDL Ratio: 2.1 {ratio} (ref 0.0–3.2)
Triglycerides: 246 mg/dL — ABNORMAL HIGH (ref 0–149)
VLDL Cholesterol Cal: 42 mg/dL — ABNORMAL HIGH (ref 5–40)

## 2023-01-28 LAB — VITAMIN D 25 HYDROXY (VIT D DEFICIENCY, FRACTURES): Vit D, 25-Hydroxy: 46.6 ng/mL (ref 30.0–100.0)

## 2023-01-28 LAB — VITAMIN B12: Vitamin B-12: 512 pg/mL (ref 232–1245)

## 2023-01-28 LAB — INSULIN, RANDOM: INSULIN: 23.6 u[IU]/mL (ref 2.6–24.9)

## 2023-01-28 LAB — HEMOGLOBIN A1C
Est. average glucose Bld gHb Est-mCnc: 114 mg/dL
Hgb A1c MFr Bld: 5.6 % (ref 4.8–5.6)

## 2023-01-28 LAB — TSH: TSH: 2.69 u[IU]/mL (ref 0.450–4.500)

## 2023-02-27 ENCOUNTER — Encounter: Payer: Self-pay | Admitting: Bariatrics

## 2023-02-27 ENCOUNTER — Ambulatory Visit: Payer: BC Managed Care – PPO | Admitting: Bariatrics

## 2023-02-27 VITALS — BP 137/77 | HR 73 | Temp 98.3°F | Ht 63.0 in | Wt 192.0 lb

## 2023-02-27 DIAGNOSIS — E669 Obesity, unspecified: Secondary | ICD-10-CM | POA: Diagnosis not present

## 2023-02-27 DIAGNOSIS — Z6834 Body mass index (BMI) 34.0-34.9, adult: Secondary | ICD-10-CM | POA: Diagnosis not present

## 2023-02-27 DIAGNOSIS — E559 Vitamin D deficiency, unspecified: Secondary | ICD-10-CM | POA: Diagnosis not present

## 2023-02-27 DIAGNOSIS — R7989 Other specified abnormal findings of blood chemistry: Secondary | ICD-10-CM | POA: Diagnosis not present

## 2023-02-27 DIAGNOSIS — E66811 Obesity, class 1: Secondary | ICD-10-CM

## 2023-02-27 NOTE — Progress Notes (Signed)
Office: [phone]  /  Fax: [phone]  WEIGHT SUMMARY AND BIOMETRICS  Weight Lost Since Last Visit: 0lb  Weight Gained Since Last Visit: 0lb   Vitals Temp: 98.3 F (36.8 C) BP: 137/77 Pulse Rate: 73 SpO2: 98 %   Anthropometric Measurements Height: 5\' 3"  (1.6 m) Weight: 192 lb (87.1 kg) BMI (Calculated): 34.02 Weight at Last Visit: 192lb Weight Lost Since Last Visit: 0lb Weight Gained Since Last Visit: 0lb Starting Weight: 230lb Total Weight Loss (lbs): 38 lb (17.2 kg)   Body Composition  Body Fat %: 41 % Fat Mass (lbs): 78.8 lbs Muscle Mass (lbs): 107.6 lbs Total Body Water (lbs): 80.6 lbs Visceral Fat Rating : 10   Other Clinical Data Fasting: No Labs: No Today's Visit #: 17 Starting Date: 11/28/22     HPI  Chief Complaint: OBESITY  Kayla Zimmerman is here to discuss her progress with her obesity treatment plan. She is on the the Category 3 Plan and states she is following her eating plan approximately 90 % of the time. She states she is exercising 30 minutes 2 days per week.   Interval History:  Since last office visit she the weight has remained the same.    Pharmacotherapy for weight loss: She is currently taking Wegovy for medical weight loss.  Denies side effects.    Previous pharmacotherapy for medical weight loss:  Phentermine and Zepbound  She stopped Phentermine  due to side effects of tachycardia and Zepbound due to nausea. Marland Kitchen  PHYSICAL EXAM:  Rodenberg pressure 137/77, pulse 73, temperature 98.3 F (36.8 C), height 5\' 3"  (1.6 m), weight 192 lb (87.1 kg), last menstrual period 02/04/2023, SpO2 98%. Body mass index is 34.01 kg/m.  General: She is overweight, cooperative, alert, well developed, and in no acute distress. PSYCH: Has normal mood, affect and thought process.   Extremities: No edema.  Neurologic: No gross sensory or motor deficits. No tremors or fasciculations noted.    DIAGNOSTIC DATA REVIEWED:  BMET    Component Value  Date/Time   NA 139 01/27/2023 0934   K 5.0 01/27/2023 0934   CL 101 01/27/2023 0934   CO2 23 01/27/2023 0934   GLUCOSE 94 01/27/2023 0934   GLUCOSE 102 (H) 04/12/2013 0937   BUN 7 01/27/2023 0934   CREATININE 0.73 01/27/2023 0934   CREATININE 0.60 04/12/2013 0937   CALCIUM 9.7 01/27/2023 0934   GFRNONAA >89 04/12/2013 0937   GFRAA >89 04/12/2013 0937   Lab Results  Component Value Date   HGBA1C 5.6 01/27/2023   HGBA1C 6.0 (H) 11/27/2021   Lab Results  Component Value Date   INSULIN 23.6 01/27/2023   INSULIN 23.7 11/27/2021   Lab Results  Component Value Date   TSH 2.690 01/27/2023   CBC    Component Value Date/Time   WBC 9.8 01/27/2023 0934   WBC 7.7 04/12/2013 0937   RBC 4.96 01/27/2023 0934   RBC 4.68 04/12/2013 0937   HGB 13.6 01/27/2023 0934   HCT 42.5 01/27/2023 0934   PLT 432 01/27/2023 0934   MCV 86 01/27/2023 0934   MCH 27.4 01/27/2023 0934   MCH 29.7 04/12/2013 0937   MCHC 32.0 01/27/2023 0934   MCHC 34.2 04/12/2013 0937   RDW 14.0 01/27/2023 0934   Iron Studies No results found for: "IRON", "TIBC", "FERRITIN", "IRONPCTSAT" Lipid Panel     Component Value Date/Time   CHOL 196 01/27/2023 0934   TRIG 246 (H) 01/27/2023 0934   HDL 49 01/27/2023 0934   CHOLHDL  3.7 04/12/2013 0937   VLDL 25 04/12/2013 0937   LDLCALC 105 (H) 01/27/2023 0934   Hepatic Function Panel     Component Value Date/Time   PROT 7.0 01/27/2023 0934   ALBUMIN 4.4 01/27/2023 0934   AST 15 01/27/2023 0934   ALT 14 01/27/2023 0934   ALKPHOS 113 01/27/2023 0934   BILITOT 0.2 01/27/2023 0934      Component Value Date/Time   TSH 2.690 01/27/2023 0934   Nutritional Lab Results  Component Value Date   VD25OH 46.6 01/27/2023   VD25OH 29.5 (L) 06/26/2022   VD25OH 27.1 (L) 11/27/2021     ASSESSMENT AND PLAN  TREATMENT PLAN FOR OBESITY:  Recommended Dietary Goals  Ranetta is currently in the action stage of change. As such, her goal is to continue weight management  plan. She has agreed to the Category 3 Plan.  Behavioral Intervention  We discussed the following Behavioral Modification Strategies today: increasing lean protein intake to established goals, increasing vegetables, avoiding skipping meals, increasing water intake , reading food labels , keeping healthy foods at home, planning for success, better snacking choices, and continue to work on maintaining a reduced calorie state, getting the recommended amount of protein, incorporating whole foods, making healthy choices, staying well hydrated and practicing mindfulness when eating..  Additional resources provided today: NA  Recommended Physical Activity Goals  Jermisha has been advised to work up to 150 minutes of moderate intensity aerobic activity a week and strengthening exercises 2-3 times per week for cardiovascular health, weight loss maintenance and preservation of muscle mass.   She has agreed to Continue current level of physical activity , Think about enjoyable ways to increase daily physical activity and overcoming barriers to exercise, and information sheet on chair exercises and resistance bands.  She was also given a resistance band.  We discussed getting a walking pad.    Pharmacotherapy We discussed various medication options to help Hassie with her weight loss efforts and we both agreed to stay with [facility].   ASSOCIATED CONDITIONS ADDRESSED TODAY  Action/Plan   B 12 deficiency:   She is taking a B 12 injection.  She has a history of B12 deficiency and had the following s/s in the past.  She states that her energy level has improved and she wants to continue the B12 injections  Plan:  Her B12 injection was given here today by Lorina Rabon, CMA.  We will continue her B12 injections and will monitor her as needed.   Vitamin D Deficiency Vitamin D is not at goal of 50.  Most recent vitamin D level was 46.6. She is on  prescription ergocalciferol 50,000 IU weekly. Lab Results   Component Value Date   VD25OH 46.6 01/27/2023   VD25OH 29.5 (L) 06/26/2022   VD25OH 27.1 (L) 11/27/2021    Plan: Continue prescription vitamin D 50,000 IU weekly.    She will f/u in 4 weeks.  She was informed of the importance of frequent follow up visits to maximize her success with intensive lifestyle modifications for her multiple health conditions.   ATTESTASTION STATEMENTS:  Reviewed by clinician on day of visit: allergies, medications, problem list, medical history, surgical history, family history, social history, and previous encounter notes.   Corinna Capra, DO

## 2023-03-24 ENCOUNTER — Ambulatory Visit: Payer: BC Managed Care – PPO | Admitting: Nurse Practitioner

## 2023-03-24 ENCOUNTER — Encounter: Payer: Self-pay | Admitting: Nurse Practitioner

## 2023-03-24 VITALS — BP 135/85 | HR 77 | Temp 98.3°F | Ht 63.0 in | Wt 190.0 lb

## 2023-03-24 DIAGNOSIS — R7989 Other specified abnormal findings of blood chemistry: Secondary | ICD-10-CM

## 2023-03-24 DIAGNOSIS — R7303 Prediabetes: Secondary | ICD-10-CM

## 2023-03-24 DIAGNOSIS — E559 Vitamin D deficiency, unspecified: Secondary | ICD-10-CM | POA: Diagnosis not present

## 2023-03-24 DIAGNOSIS — E782 Mixed hyperlipidemia: Secondary | ICD-10-CM | POA: Diagnosis not present

## 2023-03-24 DIAGNOSIS — E6609 Other obesity due to excess calories: Secondary | ICD-10-CM

## 2023-03-24 DIAGNOSIS — Z6833 Body mass index (BMI) 33.0-33.9, adult: Secondary | ICD-10-CM

## 2023-03-24 DIAGNOSIS — E66811 Obesity, class 1: Secondary | ICD-10-CM

## 2023-03-24 MED ORDER — WEGOVY 2.4 MG/0.75ML ~~LOC~~ SOAJ: 2.4000 mg | SUBCUTANEOUS | 0 refills | Status: DC

## 2023-03-24 NOTE — Progress Notes (Signed)
 Office: [phone]  /  Fax: [phone]  WEIGHT SUMMARY AND BIOMETRICS  Weight Lost Since Last Visit: 2lb  Weight Gained Since Last Visit: 0lb   Vitals Temp: 98.3 F (36.8 C) BP: 135/85 Pulse Rate: 77 SpO2: 98 %   Anthropometric Measurements Height: 5\' 3"  (1.6 m) Weight: 190 lb (86.2 kg) BMI (Calculated): 33.67 Weight at Last Visit: 192lb Weight Lost Since Last Visit: 2lb Weight Gained Since Last Visit: 0lb Starting Weight: 230lb Total Weight Loss (lbs): 40 lb (18.1 kg)   Body Composition  Body Fat %: 40.4 % Fat Mass (lbs): 76.8 lbs Muscle Mass (lbs): 107.6 lbs Total Body Water (lbs): 80 lbs Visceral Fat Rating : 10   Other Clinical Data Fasting: No Labs: No Today's Visit #: 18 Starting Date: 11/28/22     HPI  Chief Complaint: OBESITY  Kayla Zimmerman is here to discuss Kayla Zimmerman progress with Kayla Zimmerman obesity treatment plan. Kayla Zimmerman is on the the Category 3 Plan and states Kayla Zimmerman is following Kayla Zimmerman eating plan approximately 100 % of the time. Kayla Zimmerman states Kayla Zimmerman is exercising 30 minutes 3 days per week.   Interval History:  Since last office visit Kayla Zimmerman has lost 2 pounds.  Kayla Zimmerman has overall done well with weight loss.  Kayla Zimmerman is watching Kayla Zimmerman portion sizes and is making healthier choices.    Kayla Zimmerman highest weight was 243 lbs.    Pharmacotherapy for weight loss: Kayla Zimmerman is currently taking Wegovy 2.4 mg for medical weight loss.  Denies side effects.     Message from plan: CaseId:[id number];Status:Approved;Review Type:Prior Auth;Coverage Start Date:12/01/2022;Coverage End Date:12/31/2023;. Authorization Expiration Date: December 31, 2023.     Previous pharmacotherapy for medical weight loss:     Kayla Zimmerman stopped Phentermine due to side effects of tachycardia (12/19/21)  Kayla Zimmerman stopped Zepbound due to side effects of nausea, diarrhea and constipation.   Unable to take Wellbutrin due to nausea and vomiting-Avoid Contrave   Bariatric surgery:  Patient has not had bariatric surgery  Vit B 12 def Vit b12  injection given in office today.  Denies side effects.    Vit D deficiency  Kayla Zimmerman is taking Vit D 50,000 IU weekly.  Denies side effects.  Denies nausea, vomiting or muscle weakness.    Lab Results  Component Value Date   VD25OH 46.6 01/27/2023   VD25OH 29.5 (L) 06/26/2022   VD25OH 27.1 (L) 11/27/2021     Prediabetes Last A1c was 5.6  Medication(s): Wegovy 2.4 mg SQ weekly Polyphagia:No Lab Results  Component Value Date   HGBA1C 5.6 01/27/2023   HGBA1C 5.7 (H) 06/26/2022   HGBA1C 6.0 (H) 11/27/2021   Lab Results  Component Value Date   INSULIN 23.6 01/27/2023   INSULIN 33.5 (H) 06/26/2022   INSULIN 23.7 11/27/2021   Hyperlipidemia Medication(s): None.  FH:  unknown  Lab Results  Component Value Date   CHOL 196 01/27/2023   HDL 49 01/27/2023   LDLCALC 105 (H) 01/27/2023   TRIG 246 (H) 01/27/2023   CHOLHDL 3.7 04/12/2013   Lab Results  Component Value Date   ALT 14 01/27/2023   AST 15 01/27/2023   ALKPHOS 113 01/27/2023   BILITOT 0.2 01/27/2023   The 10-year ASCVD risk score (Arnett DK, et al., 2019) is: 1.1%   Values used to calculate the score:     Age: 48 years     Sex: Female     Is Non-Hispanic African American: No     Diabetic: No     Tobacco smoker: No  Systolic Heater Pressure: 135 mmHg     Is BP treated: No     HDL Cholesterol: 49 mg/dL     Total Cholesterol: 196 mg/dL   PHYSICAL EXAM:  Mariani pressure 135/85, pulse 77, temperature 98.3 F (36.8 C), height 5\' 3"  (1.6 m), weight 190 lb (86.2 kg), last menstrual period 03/07/2023, SpO2 98%. Body mass index is 33.66 kg/m.  General: Kayla Zimmerman is overweight, cooperative, alert, well developed, and in no acute distress. PSYCH: Has normal mood, affect and thought process.   Extremities: No edema.  Neurologic: No gross sensory or motor deficits. No tremors or fasciculations noted.    DIAGNOSTIC DATA REVIEWED:  BMET    Component Value Date/Time   NA 139 01/27/2023 0934   K 5.0 01/27/2023 0934   CL  101 01/27/2023 0934   CO2 23 01/27/2023 0934   GLUCOSE 94 01/27/2023 0934   GLUCOSE 102 (H) 04/12/2013 0937   BUN 7 01/27/2023 0934   CREATININE 0.73 01/27/2023 0934   CREATININE 0.60 04/12/2013 0937   CALCIUM 9.7 01/27/2023 0934   GFRNONAA >89 04/12/2013 0937   GFRAA >89 04/12/2013 0937   Lab Results  Component Value Date   HGBA1C 5.6 01/27/2023   HGBA1C 6.0 (H) 11/27/2021   Lab Results  Component Value Date   INSULIN 23.6 01/27/2023   INSULIN 23.7 11/27/2021   Lab Results  Component Value Date   TSH 2.690 01/27/2023   CBC    Component Value Date/Time   WBC 9.8 01/27/2023 0934   WBC 7.7 04/12/2013 0937   RBC 4.96 01/27/2023 0934   RBC 4.68 04/12/2013 0937   HGB 13.6 01/27/2023 0934   HCT 42.5 01/27/2023 0934   PLT 432 01/27/2023 0934   MCV 86 01/27/2023 0934   MCH 27.4 01/27/2023 0934   MCH 29.7 04/12/2013 0937   MCHC 32.0 01/27/2023 0934   MCHC 34.2 04/12/2013 0937   RDW 14.0 01/27/2023 0934   Iron Studies No results found for: "IRON", "TIBC", "FERRITIN", "IRONPCTSAT" Lipid Panel     Component Value Date/Time   CHOL 196 01/27/2023 0934   TRIG 246 (H) 01/27/2023 0934   HDL 49 01/27/2023 0934   CHOLHDL 3.7 04/12/2013 0937   VLDL 25 04/12/2013 0937   LDLCALC 105 (H) 01/27/2023 0934   Hepatic Function Panel     Component Value Date/Time   PROT 7.0 01/27/2023 0934   ALBUMIN 4.4 01/27/2023 0934   AST 15 01/27/2023 0934   ALT 14 01/27/2023 0934   ALKPHOS 113 01/27/2023 0934   BILITOT 0.2 01/27/2023 0934      Component Value Date/Time   TSH 2.690 01/27/2023 0934   Nutritional Lab Results  Component Value Date   VD25OH 46.6 01/27/2023   VD25OH 29.5 (L) 06/26/2022   VD25OH 27.1 (L) 11/27/2021     ASSESSMENT AND PLAN  TREATMENT PLAN FOR OBESITY:  Recommended Dietary Goals  Kayla Zimmerman is currently in the action stage of change. As such, Kayla Zimmerman goal is to continue weight management plan. Kayla Zimmerman has agreed to the Category 3 Plan.  Behavioral  Intervention  We discussed the following Behavioral Modification Strategies today: increasing lean protein intake to established goals, decreasing simple carbohydrates , increasing vegetables, increasing water intake , work on meal planning and preparation, keeping healthy foods at home, continue to work on implementation of reduced calorie nutritional plan, continue to practice mindfulness when eating, planning for success, and continue to work on maintaining a reduced calorie state, getting the recommended amount of protein, incorporating whole  foods, making healthy choices, staying well hydrated and practicing mindfulness when eating..  Additional resources provided today: NA  Recommended Physical Activity Goals  Kayla Zimmerman has been advised to work up to 150 minutes of moderate intensity aerobic activity a week and strengthening exercises 2-3 times per week for cardiovascular health, weight loss maintenance and preservation of muscle mass.   Kayla Zimmerman has agreed to Think about enjoyable ways to increase daily physical activity and overcoming barriers to exercise, Increase physical activity in their day and reduce sedentary time (increase NEAT)., Increase the intensity, frequency or duration of strengthening exercises , and Increase the intensity, frequency or duration of aerobic exercises     Pharmacotherapy We discussed various medication options to help Kayla Zimmerman with Kayla Zimmerman weight loss efforts and we both agreed to continue [facility] 2.4mg .  Side effects discussed. Refill given.    Avoid Phentermine and Qsymia due to side effects of tachycardia Avoid Contrave due to side effects of nausea and vomiting with Wellbutrin Stopped Zepbound due to side effects of nausea  ASSOCIATED CONDITIONS ADDRESSED TODAY  Action/Plan  Low vitamin B12 level Vit B12 injection given today  Vitamin D deficiency To continue Vit D but will take every 2 weeks  Prediabetes Improved, will continue to montor Continue Wegovy  2.4mg .  Side effects discussed  Mixed hyperlipidemia Cardiovascular risk and specific lipid/LDL goals reviewed.  We discussed several lifestyle modifications today and Kayla Zimmerman will continue to work on diet, exercise and weight loss efforts. Orders and follow up as documented in patient record.   Counseling Intensive lifestyle modifications are the first line treatment for this issue. Dietary changes: Increase soluble fiber. Decrease simple carbohydrates. Exercise changes: Moderate to vigorous-intensity aerobic activity 150 minutes per week if tolerated. Lipid-lowering medications: see documented in medical record.   Can start a fish oil OTC.  Side effects discussed  Class 1 obesity due to excess calories without serious comorbidity with body mass index (BMI) of 33.0 to 33.9 in adult     Labs reviewed in chart with patient from 01/27/23    Return in about 4 weeks (around 04/21/2023).Marland Kitchen Kayla Zimmerman was informed of the importance of frequent follow up visits to maximize Kayla Zimmerman success with intensive lifestyle modifications for Kayla Zimmerman multiple health conditions.   ATTESTASTION STATEMENTS:  Reviewed by clinician on day of visit: allergies, medications, problem list, medical history, surgical history, family history, social history, and previous encounter notes.    Theodis Sato. Halaina Vanduzer FNP-C

## 2023-03-26 ENCOUNTER — Ambulatory Visit: Payer: BC Managed Care – PPO | Admitting: Nurse Practitioner

## 2023-04-23 ENCOUNTER — Ambulatory Visit: Admitting: Internal Medicine

## 2023-04-23 ENCOUNTER — Encounter: Payer: Self-pay | Admitting: Internal Medicine

## 2023-04-23 VITALS — BP 100/70 | HR 64 | Temp 98.5°F | Ht 63.0 in | Wt 192.4 lb

## 2023-04-23 DIAGNOSIS — H6592 Unspecified nonsuppurative otitis media, left ear: Secondary | ICD-10-CM

## 2023-04-23 MED ORDER — AMOXICILLIN-POT CLAVULANATE 875-125 MG PO TABS: 1.0000 | ORAL_TABLET | Freq: Two times a day (BID) | ORAL | 0 refills | Status: DC

## 2023-04-23 NOTE — Patient Instructions (Addendum)
 Once daily Zyrtec-D for 7 days. Then can go back to taking Claritin or Zyrtec as needed for allergies.   Tylenol or Ibuprofen as needed for pain

## 2023-04-23 NOTE — Progress Notes (Signed)
 [facility] [address] [place] [address] Dept: [phone] Dept Fax: [phone]  Acute Care Office Visit  Subjective:   Kayla Zimmerman 07/15/1975 04/23/2023  Chief Complaint  Patient presents with   Ear Pain    Started Sunday     HPI: Discussed the use of AI scribe software for clinical note transcription with the patient, who gave verbal consent to proceed.  History of Present Illness   Kayla Zimmerman is a 48 year old female who presents with left ear pain.  She has experienced left ear pain since Sunday evening, with no improvement despite flushing the ear and using a heating pad. Sinus congestion is present, likely due to seasonal allergies. There is no fever , otorrhea, or hearing loss. No injury. She feels slightly unsteady and has balance issues, which she attributes to the ear problem, including an incident where she ran into a door. She occasionally uses Zyrtec or Claritin for allergies but has not started antihistamines this season. She has no known allergies to antibiotics.       The following portions of the patient's history were reviewed and updated as appropriate: past medical history, past surgical history, family history, social history, allergies, medications, and problem list.   Patient Active Problem List   Diagnosis Date Noted   Vitamin B 12 deficiency 11/29/2022   Routine general medical examination at a health care facility 11/29/2022   Acute right-sided low back pain without sciatica 11/22/2022   Mixed hyperlipidemia 12/11/2021   Elevated liver enzymes 12/11/2021   Prediabetes 11/28/2021   Elevated cholesterol 11/28/2021   Other fatigue 11/27/2021   SOB (shortness of breath) on exertion 11/27/2021   Elevated glucose 11/27/2021   Low HDL (under 40) 11/27/2021   Health care maintenance 11/27/2021   Vitamin D deficiency 11/27/2021   Menstrual migraine without status migrainosus, not  intractable 10/24/2021   Depression, major, single episode, moderate (HCC) 10/24/2021   Cubital tunnel syndrome on left 10/24/2021   Morbid obesity (HCC) 06/20/2013   Ganglion of joint 04/09/2013   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   Past Medical History:  Diagnosis Date   Arrhythmia 2002   Mitral valve prolapse   Back pain    Chronic back pain    Depression    Ganglion of joint    Migraines    MVP (mitral valve prolapse)    Past Surgical History:  Procedure Laterality Date   WISDOM TOOTH EXTRACTION     Family History  Problem Relation Age of Onset   Cancer Mother        leukemia   Mitral valve prolapse Mother    Arrhythmia Mother    Cancer Father        colon cancer   Breast cancer Maternal Aunt    Breast cancer Paternal Aunt    Mitral valve prolapse Maternal Grandfather    Arrhythmia Maternal Grandfather    Cancer Paternal Grandfather        skin, prostate   Diabetes Paternal Grandfather    Hypertension Paternal Grandfather     Current Outpatient Medications:    amoxicillin-clavulanate (AUGMENTIN) 875-125 MG tablet, Take 1 tablet by mouth 2 (two) times daily for 7 days., Disp: 14 tablet, Rfl: 0   B-D 3CC LUER-LOK SYR 25GX1" 25G X 1" 3 ML MISC, , Disp: , Rfl:    cyanocobalamin (VITAMIN B12) 1000 MCG/ML injection, Inject 1 mL (1,000 mcg total) into the muscle every 30 (thirty) days., Disp:  1 mL, Rfl: 4   escitalopram (LEXAPRO) 10 MG tablet, Take 1 tablet (10 mg total) by mouth daily., Disp: 90 tablet, Rfl: 3   Multiple Vitamin (MULTI-VITAMINS) TABS, Take 1 tablet by mouth daily., Disp: , Rfl:    NEEDLE, DISP, 25 G (B-D DISP NEEDLE 25GX1") 25G X 1" MISC, Take as directed with Vit B12, Disp: 10 each, Rfl: 0   Semaglutide-Weight Management (WEGOVY) 2.4 MG/0.75ML SOAJ, Inject 2.4 mg into the skin once a week., Disp: 9 mL, Rfl: 0   Vitamin D, Ergocalciferol, (DRISDOL) 1.25 MG (50000 UNIT) CAPS capsule, Take 1 capsule (50,000 Units total) by mouth every 7 (seven)  days., Disp: 12 capsule, Rfl: 0 Allergies  Allergen Reactions   Dexamethasone Sodium Phosphate Rash   Phentermine Other (See Comments)    Heart race   Wellbutrin [Bupropion] Nausea And Vomiting     ROS: A complete ROS was performed with pertinent positives/negatives noted in the HPI. The remainder of the ROS are negative.    Objective:   Today's Vitals   04/23/23 1437  BP: 100/70  Pulse: 64  Temp: 98.5 F (36.9 C)  TempSrc: Temporal  SpO2: 98%  Weight: 192 lb 6.4 oz (87.3 kg)  Height: 5\' 3"  (1.6 m)    GENERAL: Well-appearing, in NAD. Well nourished.  SKIN: Pink, warm and dry. No rash.  HEENT:    HEAD: Normocephalic, non-traumatic.  EYES: Conjunctive pink without exudate. EARS: External ear w/o redness, swelling, masses, or lesions. EAC clear.  Left TM very cloudy and slightly bulging. No redness. TM's intact,  appropriate landmarks visualized.  RESPIRATORY: Chest wall symmetrical. Respirations even and non-labored. PSYCH/MENTAL STATUS: Alert, oriented x 3. Cooperative, appropriate mood and affect.    No results found for any visits on 04/23/23.    Assessment & Plan:  Assessment and Plan    Left Otitis Media with Effusion Left ear pain with fluid buildup, potential infection. Hearing loss and dizziness due to fluid affecting equilibrium. - Prescribed Augmentin 875 mg orally twice daily for 7 days - Advised Zyrtec-D once daily for one week, then resume Claritin or Zyrtec as needed. - Recommended Tylenol or ibuprofen for pain. - Advised follow-up if symptoms worsen or persist.       Meds ordered this encounter  Medications   amoxicillin-clavulanate (AUGMENTIN) 875-125 MG tablet    Sig: Take 1 tablet by mouth 2 (two) times daily for 7 days.    Dispense:  14 tablet    Refill:  0    Supervising Provider:   Garnette Gunner [[id number]]   No orders of the defined types were placed in this encounter.  Lab Orders  No laboratory test(s) ordered today   No images are  attached to the encounter or orders placed in the encounter.  Return if symptoms worsen or fail to improve.   Salvatore Decent, FNP

## 2023-04-24 ENCOUNTER — Ambulatory Visit: Payer: BC Managed Care – PPO | Admitting: Nurse Practitioner

## 2023-04-24 ENCOUNTER — Encounter: Payer: Self-pay | Admitting: Nurse Practitioner

## 2023-04-24 VITALS — BP 112/57 | HR 78 | Temp 98.2°F | Ht 63.0 in | Wt 188.0 lb

## 2023-04-24 DIAGNOSIS — R7989 Other specified abnormal findings of blood chemistry: Secondary | ICD-10-CM | POA: Diagnosis not present

## 2023-04-24 DIAGNOSIS — E559 Vitamin D deficiency, unspecified: Secondary | ICD-10-CM

## 2023-04-24 DIAGNOSIS — Z6833 Body mass index (BMI) 33.0-33.9, adult: Secondary | ICD-10-CM | POA: Diagnosis not present

## 2023-04-24 DIAGNOSIS — E66811 Obesity, class 1: Secondary | ICD-10-CM | POA: Diagnosis not present

## 2023-04-24 MED ORDER — "BD DISP NEEDLE 25G X 1"" MISC": 0 refills | Status: DC

## 2023-04-24 MED ORDER — VITAMIN D (ERGOCALCIFEROL) 1.25 MG (50000 UNIT) PO CAPS: 50000.0000 [IU] | ORAL_CAPSULE | ORAL | 0 refills | Status: DC

## 2023-04-24 MED ORDER — CYANOCOBALAMIN 1000 MCG/ML IJ SOLN: 1000.0000 ug | INTRAMUSCULAR | 4 refills | Status: DC

## 2023-04-24 NOTE — Progress Notes (Signed)
 Office: [phone]  /  Fax: [phone]  WEIGHT SUMMARY AND BIOMETRICS  Weight Lost Since Last Visit: 2lb  Weight Gained Since Last Visit: 0lb   Vitals Temp: 98.2 F (36.8 C) BP: (!) 112/57 Pulse Rate: 78 SpO2: 100 %   Anthropometric Measurements Height: 5\' 3"  (1.6 m) Weight: 188 lb (85.3 kg) BMI (Calculated): 33.31 Weight at Last Visit: 190lb Weight Lost Since Last Visit: 2lb Weight Gained Since Last Visit: 0lb Starting Weight: 230lb Total Weight Loss (lbs): 42 lb (19.1 kg)   Body Composition  Body Fat %: 40.1 % Fat Mass (lbs): 75.6 lbs Muscle Mass (lbs): 107.2 lbs Total Body Water (lbs): 81.6 lbs Visceral Fat Rating : 9   Other Clinical Data Fasting: No Labs: No Today's Visit #: 19 Starting Date: 11/28/22     HPI  Chief Complaint: OBESITY  Kayla Zimmerman is here to discuss her progress with her obesity treatment plan. She is on the the Category 3 Plan and states she is following her eating plan approximately 100 % of the time. She states she is exercising 20-30 minutes 3 days per week.   Interval History:  Since last office visit she has lost 2 pounds.  She is dong well with Cat 3 meal plan.  She recently planted her garden. She is doing a 5k this weekend.  Drinking water daily.    Her highest weight was 243 lbs.     Pharmacotherapy for weight loss: She is currently taking Wegovy 2.4 mg for medical weight loss.  Denies side effects.     Wegovy approved through December 31, 2023.     Previous pharmacotherapy for medical weight loss:   She stopped Phentermine due to side effects of tachycardia (12/19/21)  She stopped Zepbound due to side effects of nausea, diarrhea and constipation.   Unable to take Wellbutrin due to nausea and vomiting-Avoid Contrave   Bariatric surgery:  Patient has not had bariatric surgery  Vit B 12 def Taking Vit b12 injections-last injection was 03/24/23.  Denies side effects.  Vit D deficiency  She is taking Vit D 50,000 IU  weekly.  Denies side effects.  Denies nausea, vomiting or muscle weakness.    Lab Results  Component Value Date   VD25OH 46.6 01/27/2023   VD25OH 29.5 (L) 06/26/2022   VD25OH 27.1 (L) 11/27/2021     PHYSICAL EXAM:  Nylund pressure (!) 112/57, pulse 78, temperature 98.2 F (36.8 C), height 5\' 3"  (1.6 m), weight 188 lb (85.3 kg), last menstrual period 04/16/2023, SpO2 100%. Body mass index is 33.3 kg/m.  General: She is overweight, cooperative, alert, well developed, and in no acute distress. PSYCH: Has normal mood, affect and thought process.   Extremities: No edema.  Neurologic: No gross sensory or motor deficits. No tremors or fasciculations noted.    DIAGNOSTIC DATA REVIEWED:  BMET    Component Value Date/Time   NA 139 01/27/2023 0934   K 5.0 01/27/2023 0934   CL 101 01/27/2023 0934   CO2 23 01/27/2023 0934   GLUCOSE 94 01/27/2023 0934   GLUCOSE 102 (H) 04/12/2013 0937   BUN 7 01/27/2023 0934   CREATININE 0.73 01/27/2023 0934   CREATININE 0.60 04/12/2013 0937   CALCIUM 9.7 01/27/2023 0934   GFRNONAA >89 04/12/2013 0937   GFRAA >89 04/12/2013 0937   Lab Results  Component Value Date   HGBA1C 5.6 01/27/2023   HGBA1C 6.0 (H) 11/27/2021   Lab Results  Component Value Date   INSULIN 23.6 01/27/2023  INSULIN 23.7 11/27/2021   Lab Results  Component Value Date   TSH 2.690 01/27/2023   CBC    Component Value Date/Time   WBC 9.8 01/27/2023 0934   WBC 7.7 04/12/2013 0937   RBC 4.96 01/27/2023 0934   RBC 4.68 04/12/2013 0937   HGB 13.6 01/27/2023 0934   HCT 42.5 01/27/2023 0934   PLT 432 01/27/2023 0934   MCV 86 01/27/2023 0934   MCH 27.4 01/27/2023 0934   MCH 29.7 04/12/2013 0937   MCHC 32.0 01/27/2023 0934   MCHC 34.2 04/12/2013 0937   RDW 14.0 01/27/2023 0934   Iron Studies No results found for: "IRON", "TIBC", "FERRITIN", "IRONPCTSAT" Lipid Panel     Component Value Date/Time   CHOL 196 01/27/2023 0934   TRIG 246 (H) 01/27/2023 0934   HDL 49  01/27/2023 0934   CHOLHDL 3.7 04/12/2013 0937   VLDL 25 04/12/2013 0937   LDLCALC 105 (H) 01/27/2023 0934   Hepatic Function Panel     Component Value Date/Time   PROT 7.0 01/27/2023 0934   ALBUMIN 4.4 01/27/2023 0934   AST 15 01/27/2023 0934   ALT 14 01/27/2023 0934   ALKPHOS 113 01/27/2023 0934   BILITOT 0.2 01/27/2023 0934      Component Value Date/Time   TSH 2.690 01/27/2023 0934   Nutritional Lab Results  Component Value Date   VD25OH 46.6 01/27/2023   VD25OH 29.5 (L) 06/26/2022   VD25OH 27.1 (L) 11/27/2021     ASSESSMENT AND PLAN  TREATMENT PLAN FOR OBESITY:  Recommended Dietary Goals  Kayla Zimmerman is currently in the action stage of change. As such, her goal is to continue weight management plan. She has agreed to the Category 3 Plan.  Behavioral Intervention  We discussed the following Behavioral Modification Strategies today: increasing lean protein intake to established goals, increasing vegetables, increasing fiber rich foods, increasing water intake , continue to work on implementation of reduced calorie nutritional plan, continue to practice mindfulness when eating, and continue to work on maintaining a reduced calorie state, getting the recommended amount of protein, incorporating whole foods, making healthy choices, staying well hydrated and practicing mindfulness when eating..  Additional resources provided today: NA  Recommended Physical Activity Goals  Kayla Zimmerman has been advised to work up to 150 minutes of moderate intensity aerobic activity a week and strengthening exercises 2-3 times per week for cardiovascular health, weight loss maintenance and preservation of muscle mass.   She has agreed to Think about enjoyable ways to increase daily physical activity and overcoming barriers to exercise, Increase physical activity in their day and reduce sedentary time (increase NEAT)., Increase the intensity, frequency or duration of strengthening exercises , and  Increase the intensity, frequency or duration of aerobic exercises     Pharmacotherapy We discussed various medication options to help Kayla Zimmerman with her weight loss efforts and we both agreed to continue [facility] 2.4mg .  Side effects discussed.  Her husband has had a vasectomy.    Avoid Phentermine and Qsymia due to side effects to Phentermine Avoid Contrave due to side effects to Wellbutrin  ASSOCIATED CONDITIONS ADDRESSED TODAY  Action/Plan  Low vitamin B12 level -     Cyanocobalamin; Inject 1 mL (1,000 mcg total) into the muscle every 30 (thirty) days.  Dispense: 1 mL; Refill: 4 -     BD Disp Needle; Take as directed with Vit B12  Dispense: 10 each; Refill: 0  Vitamin D deficiency -     Vitamin D (Ergocalciferol); Take 1 capsule (  50,000 Units total) by mouth every 7 (seven) days.  Dispense: 12 capsule; Refill: 0  Class 1 obesity due to excess calories without serious comorbidity with body mass index (BMI) of 33.0 to 33.9 in adult         Return in about 4 weeks (around 05/22/2023).Marland Kitchen She was informed of the importance of frequent follow up visits to maximize her success with intensive lifestyle modifications for her multiple health conditions.   ATTESTASTION STATEMENTS:  Reviewed by clinician on day of visit: allergies, medications, problem list, medical history, surgical history, family history, social history, and previous encounter notes.   Theodis Sato. Venida Tsukamoto FNP-C

## 2023-04-29 ENCOUNTER — Ambulatory Visit: Payer: Self-pay

## 2023-04-29 NOTE — Telephone Encounter (Signed)
 Copied From CRM [id number]. Reason for Triage: ear pain not better after antibiotics,1 pill left  Pressure in ear   1st attempt--Called patient's mobile number and the call could not be completed as dialed.  Called patient's home phone number and left a voicemail for a call back.

## 2023-04-29 NOTE — Telephone Encounter (Signed)
 Noted. appointment is made for tomorrow 3/26/205 at her PCP office with Dr Herbie Drape at 1pm.

## 2023-04-29 NOTE — Telephone Encounter (Signed)
  Chief Complaint: Left ear pain Symptoms: pain/pressure Frequency: since before the 19th Pertinent Negatives: Patient denies fever at this moment, drainage, dizziness, vomiting, runny nose Disposition: [] ED /[] Urgent Care (no appt availability in office) / [x] Appointment(In office/virtual)/ []  [facility]/ [] Home Care/ [] Refused Recommended Disposition /[] [facility]/ []  Follow-up with PCP Additional Notes: Patient called and advised that she is still having She has two antibiotic pills left and she has not missed any and her ear has not gotten any better. Patient states that this left ear feels like there is pressure and her hearing in this ear feels sort of like she is in a barrel. She denies any fever at this time, drainage, dizziness, vomiting, runny nose.  She does also endorse headaches, her neck feeling stiff (but she is capable of touching her chin to her chest), and stating that she feels like her balance is slightly off with this ear infection. Patient is given Care Advice and appointment is made for tomorrow 3/26/205 at her PCP office with Dr Herbie Drape at 1pm. Patient is also advised that if anything worsens to go to the Emergency Room.  Patient verbalized understanding and was very appreciative of being able to be seen tomorrow.  Reason for Disposition  [1] Taking antibiotic > 72 hours (3 days) and [2] pain persists or recurs  Answer Assessment - Initial Assessment Questions 1. ANTIBIOTIC: "What antibiotic are you taking?" "How many times per day?"     Augmentin--twice 2. ONSET: "When was the antibiotic started?"     04/23/2023 3. LOCATION: "Which ear is involved?"     left 4. PAIN: "How bad is the pain?"   (Scale 1-10; mild, moderate or severe)   - MILD (1-3): doesn't interfere with normal activities    - MODERATE (4-7): interferes with normal activities or awakens from sleep    - SEVERE (8-10): excruciating pain, unable to do any normal activities       5 5. FEVER: "Do you have a fever?" If Yes, ask: "What is your temperature, how was it measured, and when did it start?"     Not at the moment--but she states she did have one off and on 6. DISCHARGE: "Is there any discharge from the ear?"     Not that she has noticed 7. OTHER SYMPTOMS: "Do you have any other symptoms?" (e.g., headache, stiff neck, dizziness, vomiting, runny nose)     Headache, stiff neck 8. PREGNANCY: "Is there any chance you are pregnant?" "When was your last menstrual period?"     No--March 12th  Protocols used: Ear - Otitis Media Follow-up Call-[facility]

## 2023-04-30 ENCOUNTER — Encounter: Payer: Self-pay | Admitting: Nurse Practitioner

## 2023-04-30 ENCOUNTER — Ambulatory Visit: Admitting: Nurse Practitioner

## 2023-04-30 VITALS — BP 108/70 | HR 66 | Temp 97.4°F | Ht 63.0 in | Wt 187.6 lb

## 2023-04-30 DIAGNOSIS — H6690 Otitis media, unspecified, unspecified ear: Secondary | ICD-10-CM | POA: Diagnosis not present

## 2023-04-30 MED ORDER — LEVOFLOXACIN 500 MG PO TABS: 500.0000 mg | ORAL_TABLET | Freq: Every day | ORAL | 0 refills | Status: DC

## 2023-04-30 NOTE — Patient Instructions (Signed)
 It was great to see you!  Start Levaquin 1 tablet daily for 7 days  Don't run while taking this antibiotic.   Let's follow-up if symptoms worsen or don't improve  Take care,  Rodman Pickle, NP

## 2023-04-30 NOTE — Assessment & Plan Note (Signed)
 She experiences persistent left ear pain and pressure despite Augmentin, with symptoms of dizziness, neck stiffness, intermittent fevers, and mild congestion. Partial improvement is noted, suggesting possible bacterial resistance or inadequate response to initial therapy. Levaquin is chosen for its broad-spectrum coverage. Prescribe Levaquin 500mg  once daily. Advise switching from running to walking during the Levaquin course due to the risk of tendon injury. Can continue zyrtec daily. Follow up if symptoms worsen or do not improve.

## 2023-04-30 NOTE — Progress Notes (Signed)
 Established Patient Office Visit  Subjective   Patient ID: Kayla Zimmerman, female    DOB: [birth year]  Age: 48 y.o. MRN: [id number]  Chief Complaint  Patient presents with   Ear Pain    Left ear pain, Follow up, not feeling any better    HPI Discussed the use of AI scribe software for clinical note transcription with the patient, who gave verbal consent to proceed.  History of Present Illness   The patient presents with a persistent left ear infection. Despite a week-long course of Augmentin and Zyrtec, the patient reports no improvement in symptoms. The patient describes the pain as persistent, with associated neck stiffness and intermittent fevers. The patient also reports episodes of dizziness, particularly in the mornings. The patient has been managing the pain with Advil at night. The patient also mentions a history of allergies, particularly to trees, which are currently mild and managed with Zyrtec D as needed.        ROS See pertinent positives and negatives per HPI.    Objective:     BP 108/70 (BP Location: Right Arm, Patient Position: Sitting, Cuff Size: Normal)   Pulse 66   Temp (!) 97.4 F (36.3 C)   Ht 5\' 3"  (1.6 m)   Wt 187 lb 9.6 oz (85.1 kg)   LMP 04/16/2023 (Exact Date)   SpO2 99%   BMI 33.23 kg/m  BP Readings from Last 3 Encounters:  04/30/23 108/70  04/24/23 (!) 112/57  04/23/23 100/70   Wt Readings from Last 3 Encounters:  04/30/23 187 lb 9.6 oz (85.1 kg)  04/24/23 188 lb (85.3 kg)  04/23/23 192 lb 6.4 oz (87.3 kg)      Physical Exam Vitals and nursing note reviewed.  Constitutional:      General: She is not in acute distress.    Appearance: Normal appearance.  HENT:     Head: Normocephalic.     Right Ear: Tympanic membrane, ear canal and external ear normal.     Left Ear: External ear normal. A middle ear effusion (cloudy) is present.     Ears:     Comments: Erythematous ear canal Eyes:     Conjunctiva/sclera: Conjunctivae normal.   Cardiovascular:     Rate and Rhythm: Normal rate and regular rhythm.     Pulses: Normal pulses.     Heart sounds: Normal heart sounds.  Pulmonary:     Effort: Pulmonary effort is normal.     Breath sounds: Normal breath sounds.  Musculoskeletal:     Cervical back: Normal range of motion.  Skin:    General: Skin is warm.  Neurological:     General: No focal deficit present.     Mental Status: She is alert and oriented to person, place, and time.  Psychiatric:        Mood and Affect: Mood normal.        Behavior: Behavior normal.        Thought Content: Thought content normal.        Judgment: Judgment normal.    The 10-year ASCVD risk score (Arnett DK, et al., 2019) is: 0.7%    Assessment & Plan:   Problem List Items Addressed This Visit       Nervous and Auditory   Persistent acute otitis media - Primary   She experiences persistent left ear pain and pressure despite Augmentin, with symptoms of dizziness, neck stiffness, intermittent fevers, and mild congestion. Partial improvement is noted, suggesting possible bacterial resistance  or inadequate response to initial therapy. Levaquin is chosen for its broad-spectrum coverage. Prescribe Levaquin 500mg  once daily. Advise switching from running to walking during the Levaquin course due to the risk of tendon injury. Can continue zyrtec daily. Follow up if symptoms worsen or do not improve.        Relevant Medications   levofloxacin (LEVAQUIN) 500 MG tablet    Return if symptoms worsen or fail to improve.    Gerre Scull, NP

## 2023-05-04 ENCOUNTER — Encounter: Payer: Self-pay | Admitting: Nurse Practitioner

## 2023-05-04 DIAGNOSIS — H6690 Otitis media, unspecified, unspecified ear: Secondary | ICD-10-CM

## 2023-05-05 NOTE — Telephone Encounter (Signed)
 Left message for patient to return call.

## 2023-05-07 ENCOUNTER — Telehealth: Payer: Self-pay

## 2023-05-07 NOTE — Telephone Encounter (Signed)
 Copied from CRM [id number]. Topic: Referral - Status >> May 07, 2023  3:57 PM Sasha H wrote: Reason for CRM: Pts husband called in stating they are still waiting to hear back about ENT referral. Pt is still having dizziness and has finished medication.

## 2023-05-07 NOTE — Telephone Encounter (Signed)
 MyChart message sent to Pt with referral information.

## 2023-05-14 MED ORDER — LEVOFLOXACIN 500 MG PO TABS: 500.0000 mg | ORAL_TABLET | Freq: Every day | ORAL | 0 refills | Status: AC

## 2023-05-14 NOTE — Addendum Note (Signed)
 Addended by: Rodman Pickle A on: 05/14/2023 04:52 PM   Modules accepted: Orders

## 2023-05-29 ENCOUNTER — Ambulatory Visit: Admitting: Nurse Practitioner

## 2023-05-29 ENCOUNTER — Encounter: Payer: Self-pay | Admitting: Nurse Practitioner

## 2023-05-29 VITALS — BP 135/83 | HR 80 | Temp 98.3°F | Ht 63.0 in | Wt 191.0 lb

## 2023-05-29 DIAGNOSIS — E6609 Other obesity due to excess calories: Secondary | ICD-10-CM

## 2023-05-29 DIAGNOSIS — E66811 Obesity, class 1: Secondary | ICD-10-CM

## 2023-05-29 DIAGNOSIS — E559 Vitamin D deficiency, unspecified: Secondary | ICD-10-CM | POA: Diagnosis not present

## 2023-05-29 DIAGNOSIS — Z6833 Body mass index (BMI) 33.0-33.9, adult: Secondary | ICD-10-CM

## 2023-05-29 NOTE — Progress Notes (Signed)
 Office: [phone]  /  Fax: [phone]  WEIGHT SUMMARY AND BIOMETRICS  Weight Lost Since Last Visit: 0lb  Weight Gained Since Last Visit: 3lb   Vitals Temp: 98.3 F (36.8 C) BP: 135/83 Pulse Rate: 80 SpO2: 97 %   Anthropometric Measurements Height: 5\' 3"  (1.6 m) Weight: 191 lb (86.6 kg) BMI (Calculated): 33.84 Weight at Last Visit: 188lb Weight Lost Since Last Visit: 0lb Weight Gained Since Last Visit: 3lb Starting Weight: 230lb Total Weight Loss (lbs): 39 lb (17.7 kg)   Body Composition  Body Fat %: 40.7 % Fat Mass (lbs): 77.8 lbs Muscle Mass (lbs): 107.4 lbs Total Body Water (lbs): 85.2 lbs Visceral Fat Rating : 10   Other Clinical Data Fasting: No Labs: No Today's Visit #: 20 Starting Date: 11/28/22     HPI  Chief Complaint: OBESITY  Kayla Zimmerman is here to discuss her progress with her obesity treatment plan. She is on the the Category 3 Plan and states she is following her eating plan approximately 100 % of the time. She states she is exercising 30 minutes 5 days per week.   Interval History:  Since last office visit she has gained 3 pounds. She went to [place] last week and celebrated spring break.   She is drinking water daily. She has started back running doing a run/walk (2 miles) and is slowly increasing distance.  She is running 3 days and 2 days walking or aerobics.   She has gained almost 4 lbs water weight.    Her highest weight was 243 lbs.     Pharmacotherapy for weight loss: She is currently taking Wegovy  2.4 mg for medical weight loss.  Denies side effects.    She has lost a total of 25lbs since starting Wegovy ,    Wegovy  approved through December 31, 2023.     Previous pharmacotherapy for medical weight loss:   -She stopped Phentermine  due to side effects of tachycardia (12/19/21)  -She stopped Zepbound  due to side effects of nausea, diarrhea and constipation.   -Unable to take Wellbutrin due to nausea and vomiting-Avoid Contrave    Bariatric surgery:  Patient has not had bariatric surgery  Vit D deficiency  She is taking Vit D 50,000 IU weekly.  Denies side effects.  Denies nausea, vomiting or muscle weakness.    Lab Results  Component Value Date   VD25OH 46.6 01/27/2023   VD25OH 29.5 (L) 06/26/2022   VD25OH 27.1 (L) 11/27/2021     PHYSICAL EXAM:  Krager pressure 135/83, pulse 80, temperature 98.3 F (36.8 C), height 5\' 3"  (1.6 m), weight 191 lb (86.6 kg), last menstrual period 04/16/2023, SpO2 97%. Body mass index is 33.83 kg/m.  General: She is overweight, cooperative, alert, well developed, and in no acute distress. PSYCH: Has normal mood, affect and thought process.   Extremities: No edema.  Neurologic: No gross sensory or motor deficits. No tremors or fasciculations noted.    DIAGNOSTIC DATA REVIEWED:  BMET    Component Value Date/Time   NA 139 01/27/2023 0934   K 5.0 01/27/2023 0934   CL 101 01/27/2023 0934   CO2 23 01/27/2023 0934   GLUCOSE 94 01/27/2023 0934   GLUCOSE 102 (H) 04/12/2013 0937   BUN 7 01/27/2023 0934   CREATININE 0.73 01/27/2023 0934   CREATININE 0.60 04/12/2013 0937   CALCIUM 9.7 01/27/2023 0934   GFRNONAA >89 04/12/2013 0937   GFRAA >89 04/12/2013 0937   Lab Results  Component Value Date   HGBA1C 5.6 01/27/2023  HGBA1C 6.0 (H) 11/27/2021   Lab Results  Component Value Date   INSULIN  23.6 01/27/2023   INSULIN  23.7 11/27/2021   Lab Results  Component Value Date   TSH 2.690 01/27/2023   CBC    Component Value Date/Time   WBC 9.8 01/27/2023 0934   WBC 7.7 04/12/2013 0937   RBC 4.96 01/27/2023 0934   RBC 4.68 04/12/2013 0937   HGB 13.6 01/27/2023 0934   HCT 42.5 01/27/2023 0934   PLT 432 01/27/2023 0934   MCV 86 01/27/2023 0934   MCH 27.4 01/27/2023 0934   MCH 29.7 04/12/2013 0937   MCHC 32.0 01/27/2023 0934   MCHC 34.2 04/12/2013 0937   RDW 14.0 01/27/2023 0934   Iron Studies No results found for: "IRON", "TIBC", "FERRITIN", "IRONPCTSAT" Lipid  Panel     Component Value Date/Time   CHOL 196 01/27/2023 0934   TRIG 246 (H) 01/27/2023 0934   HDL 49 01/27/2023 0934   CHOLHDL 3.7 04/12/2013 0937   VLDL 25 04/12/2013 0937   LDLCALC 105 (H) 01/27/2023 0934   Hepatic Function Panel     Component Value Date/Time   PROT 7.0 01/27/2023 0934   ALBUMIN 4.4 01/27/2023 0934   AST 15 01/27/2023 0934   ALT 14 01/27/2023 0934   ALKPHOS 113 01/27/2023 0934   BILITOT 0.2 01/27/2023 0934      Component Value Date/Time   TSH 2.690 01/27/2023 0934   Nutritional Lab Results  Component Value Date   VD25OH 46.6 01/27/2023   VD25OH 29.5 (L) 06/26/2022   VD25OH 27.1 (L) 11/27/2021     ASSESSMENT AND PLAN  TREATMENT PLAN FOR OBESITY:  Recommended Dietary Goals  Kayla Zimmerman is currently in the action stage of change. As such, her goal is to continue weight management plan. She has agreed to the Category 4 Plan.  Behavioral Intervention  We discussed the following Behavioral Modification Strategies today: increasing lean protein intake to established goals, decreasing simple carbohydrates , increasing vegetables, increasing fiber rich foods, increasing water intake , work on meal planning and preparation, and continue to work on maintaining a reduced calorie state, getting the recommended amount of protein, incorporating whole foods, making healthy choices, staying well hydrated and practicing mindfulness when eating..  Additional resources provided today: NA  Recommended Physical Activity Goals  Kayla Zimmerman has been advised to work up to 150 minutes of moderate intensity aerobic activity a week and strengthening exercises 2-3 times per week for cardiovascular health, weight loss maintenance and preservation of muscle mass.   She has agreed to Think about enjoyable ways to increase daily physical activity and overcoming barriers to exercise, Increase physical activity in their day and reduce sedentary time (increase NEAT)., Increase the intensity,  frequency or duration of strengthening exercises , and Increase the intensity, frequency or duration of aerobic exercises     Pharmacotherapy We discussed various medication options to help Kayla Zimmerman with her weight loss efforts and we both agreed to continue Wegovy  2.4mg .  Side effects discusssed.  Avoid Phentermine  and Qsymia due to side effects to Phentermine  Avoid Contrave due to side effects to Wellbutrin  ASSOCIATED CONDITIONS ADDRESSED TODAY  Action/Plan  Vitamin D  deficiency Continue Vit D as directed.  Will recheck labs in June  Class 1 obesity due to excess calories without serious comorbidity with body mass index (BMI) of 33.0 to 33.9 in adult  Will continue to monitor water weight.  No LEE edema noted.  Increase water intake.    Will obtain labs in June  Return in about 4 weeks (around 06/26/2023).Kayla Zimmerman She was informed of the importance of frequent follow up visits to maximize her success with intensive lifestyle modifications for her multiple health conditions.   ATTESTASTION STATEMENTS:  Reviewed by clinician on day of visit: allergies, medications, problem list, medical history, surgical history, family history, social history, and previous encounter notes.   Time spent on visit including pre-visit chart review and post-visit care and charting was 30 minutes discussing nutrition, Vit D, Vit B12 injections and exercise.    Kayla Zimmerman. Kayla Parkhill FNP-C

## 2023-06-23 ENCOUNTER — Encounter (INDEPENDENT_AMBULATORY_CARE_PROVIDER_SITE_OTHER): Payer: Self-pay | Admitting: Physician Assistant

## 2023-06-23 ENCOUNTER — Ambulatory Visit (INDEPENDENT_AMBULATORY_CARE_PROVIDER_SITE_OTHER): Admitting: Physician Assistant

## 2023-06-23 VITALS — BP 144/70 | HR 76 | Ht 63.0 in | Wt 192.0 lb

## 2023-06-23 DIAGNOSIS — H6692 Otitis media, unspecified, left ear: Secondary | ICD-10-CM

## 2023-06-23 DIAGNOSIS — H6690 Otitis media, unspecified, unspecified ear: Secondary | ICD-10-CM

## 2023-06-23 MED ORDER — FLUTICASONE PROPIONATE 50 MCG/ACT NA SUSP: 2.0000 | Freq: Every day | NASAL | 6 refills | Status: DC

## 2023-06-23 MED ORDER — LORATADINE 10 MG PO TABS: 10.0000 mg | ORAL_TABLET | Freq: Every day | ORAL | 11 refills | Status: DC

## 2023-06-23 NOTE — Progress Notes (Signed)
 Dear Dr. Ricke Charleston, Here is my assessment for our mutual patient, Kayla Zimmerman. Thank you for allowing me the opportunity to care for your patient. Please do not hesitate to contact me should you have any other questions. Sincerely, Belma Boxer PA-C  Otolaryngology Clinic Note Referring provider: Dr. Ricke Charleston HPI:  Kayla Zimmerman is a 48 y.o. female kindly referred by Dr. Ricke Charleston   The patient is a 48 year old female seen in our office for evaluation of left-sided otitis media.  The patient notes that in early March she experienced pressure and pain in her left ear.  She notes she went to her primary care provider and was told that she had yellow fluid in her left ear.  She was diagnosed with otitis media and started on Augmentin .  She notes that this did not improve her symptoms, she was given a round of Levaquin , she notes after taking Levaquin  her symptoms slightly improved but did not go away, she was given another round of Levaquin  which again did not significantly improve her symptoms.  She notes over the last month or so symptoms have slowly gotten better over time.  She notes some fullness in the left ear, she denies any severe pain.  She notes that when the dizziness started it was after the first course of Augmentin , she notes that she felt slightly off balance, this has gone away completely.  She denies any associated hearing loss, ringing, nausea or vomiting.  She denies any significant past medical history of otitis media other than approximately 20 years ago.  She denies any issues with ear infections as a child, no baseline hearing deficits, no trauma to the ear, no head or neck surgeries.  She denied any associated URI symptoms when the symptoms presented.  She notes she is on Wegovy  for diabetes and weight loss and is lost approximately 60 pounds.  No issues with the Debell sugar presently.  She notes some minimal seasonal allergy for which she takes Zyrtec or Allegra not currently taking  anything.     Independent Review of Additional Tests or Records:  PCP office visit note from 04/30/2023 CBC 01/27/2024 glucose 94 A1C 01/28/2024 5.6   PMH/Meds/All/SocHx/FamHx/ROS:   Past Medical History:  Diagnosis Date   Arrhythmia 2002   Mitral valve prolapse   Back pain    Chronic back pain    Depression    Ganglion of joint    Migraines    MVP (mitral valve prolapse)      Past Surgical History:  Procedure Laterality Date   WISDOM TOOTH EXTRACTION      Family History  Problem Relation Age of Onset   Cancer Mother        leukemia   Mitral valve prolapse Mother    Arrhythmia Mother    Cancer Father        colon cancer   Breast cancer Maternal Aunt    Breast cancer Paternal Aunt    Mitral valve prolapse Maternal Grandfather    Arrhythmia Maternal Grandfather    Cancer Paternal Grandfather        skin, prostate   Diabetes Paternal Grandfather    Hypertension Paternal Grandfather      Social Connections: Not on file      Current Outpatient Medications:    B-D 3CC LUER-LOK SYR 25GX1" 25G X 1" 3 ML MISC, , Disp: , Rfl:    Cetirizine-Pseudoephedrine (ZYRTEC-D PO), Take by mouth as needed., Disp: , Rfl:    cyanocobalamin  (VITAMIN B12) 1000 MCG/ML  injection, Inject 1 mL (1,000 mcg total) into the muscle every 30 (thirty) days., Disp: 1 mL, Rfl: 4   escitalopram  (LEXAPRO ) 10 MG tablet, Take 1 tablet (10 mg total) by mouth daily., Disp: 90 tablet, Rfl: 3   Multiple Vitamin (MULTI-VITAMINS) TABS, Take 1 tablet by mouth daily., Disp: , Rfl:    NEEDLE, DISP, 25 G (B-D DISP NEEDLE 25GX1") 25G X 1" MISC, Take as directed with Vit B12, Disp: 10 each, Rfl: 0   Semaglutide -Weight Management (WEGOVY ) 2.4 MG/0.75ML SOAJ, Inject 2.4 mg into the skin once a week., Disp: 9 mL, Rfl: 0   Vitamin D , Ergocalciferol , (DRISDOL ) 1.25 MG (50000 UNIT) CAPS capsule, Take 1 capsule (50,000 Units total) by mouth every 7 (seven) days., Disp: 12 capsule, Rfl: 0   Physical Exam:   LMP  04/16/2023 (Exact Date)   Pertinent Findings  CN II-XII intact Bilateral EAC clear and TM intact with well pneumatized middle ear spaces Weber 512: equal Rinne 512: AC > BC b/l  Anterior rhinoscopy: Septum midline; bilateral inferior turbinates with no hypertrophy No lesions of oral cavity/oropharynx; dentition wnl No obviously palpable neck masses/lymphadenopathy/thyromegaly No respiratory distress or stridor  Seprately Identifiable Procedures:  None  Impression & Plans:  Kayla Zimmerman is a 48 y.o. female with the following   Left-sided otitis media-  The patient presents with left-sided ear pain.  Her symptoms have improved.  It does not sound like she had acute otitis media, more consistent with middle ear effusion.  This would also make sense given the fact that she had 3 rounds of antibiotic with no significant improvement in the symptoms.  She has no reported hearing loss today, I see no effusion on exam.  I would recommend she start nasal saline rinses, Flonase , Claritin .  She will need to follow-up for audiological evaluation.  If she does have effusion at that time on either exam or audiological evaluation she will need nasal endoscopy given its unilateral.  She understands to reach out to our office immediately if she develops any new or worsening signs or symptoms in the meantime.   - f/u 2 to 3 weeks for audiological evaluation and potential nasal endoscopy if symptoms persist.   Thank you for allowing me the opportunity to care for your patient. Please do not hesitate to contact me should you have any other questions.  Sincerely, Belma Boxer PA-C [facility] Phone: [phone] Fax: [phone]  06/23/2023, 12:59 PM

## 2023-06-24 ENCOUNTER — Ambulatory Visit: Admitting: Nurse Practitioner

## 2023-06-24 ENCOUNTER — Telehealth: Payer: Self-pay

## 2023-06-24 VITALS — BP 128/77 | HR 67 | Temp 98.3°F | Ht 63.0 in | Wt 190.0 lb

## 2023-06-24 DIAGNOSIS — E559 Vitamin D deficiency, unspecified: Secondary | ICD-10-CM

## 2023-06-24 DIAGNOSIS — E6609 Other obesity due to excess calories: Secondary | ICD-10-CM

## 2023-06-24 DIAGNOSIS — E66811 Obesity, class 1: Secondary | ICD-10-CM | POA: Diagnosis not present

## 2023-06-24 DIAGNOSIS — Z6833 Body mass index (BMI) 33.0-33.9, adult: Secondary | ICD-10-CM | POA: Diagnosis not present

## 2023-06-24 DIAGNOSIS — R7989 Other specified abnormal findings of blood chemistry: Secondary | ICD-10-CM

## 2023-06-24 MED ORDER — ZEPBOUND 5 MG/0.5ML ~~LOC~~ SOAJ: 5.0000 mg | SUBCUTANEOUS | 0 refills | Status: DC

## 2023-06-24 MED ORDER — VITAMIN D (ERGOCALCIFEROL) 1.25 MG (50000 UNIT) PO CAPS: 50000.0000 [IU] | ORAL_CAPSULE | ORAL | 0 refills | Status: DC

## 2023-06-24 MED ORDER — CYANOCOBALAMIN 1000 MCG/ML IJ SOLN: 1000.0000 ug | INTRAMUSCULAR | 4 refills | Status: DC

## 2023-06-24 NOTE — Telephone Encounter (Signed)
 PA submitted through Cover My Meds for Zepbound . Awaiting insurance determination. Key: Kayla Zimmerman

## 2023-06-24 NOTE — Patient Instructions (Signed)

## 2023-06-24 NOTE — Progress Notes (Signed)
 Office: [phone]  /  Fax: [phone]  WEIGHT SUMMARY AND BIOMETRICS  Weight Lost Since Last Visit: 1lb  Weight Gained Since Last Visit: 0lb   Vitals Temp: 98.3 F (36.8 C) BP: 128/77 Pulse Rate: 67 SpO2: 99 %   Anthropometric Measurements Height: 5\' 3"  (1.6 m) Weight: 190 lb (86.2 kg) BMI (Calculated): 33.67 Weight at Last Visit: 191lb Weight Lost Since Last Visit: 1lb Weight Gained Since Last Visit: 0lb Starting Weight: 230lb Total Weight Loss (lbs): 40 lb (18.1 kg)   Body Composition  Body Fat %: 39.7 % Fat Mass (lbs): 75.8 lbs Muscle Mass (lbs): 109.2 lbs Total Body Water (lbs): 80.2 lbs Visceral Fat Rating : 10   Other Clinical Data Fasting: No Labs: no Today's Visit #: 21 Starting Date: 11/28/22     HPI  Chief Complaint: OBESITY  Kayla Zimmerman is here to discuss her progress with her obesity treatment plan. She is on the the Category 3 Plan and states she is following her eating plan approximately 90 % of the time. She states she is exercising 30 minutes 5 days per week.   Interval History:  Since last office visit she has lost 1 pound.  She is concerned that her weight loss has been tapering off for awhile. Reports polyphagia and cravings.   She is drinking water daily. Denies sugary drinks.         Water weight down 5 lbs.    Her highest weight was 243 lbs.     Pharmacotherapy for weight loss: She is currently taking Wegovy  2.4 mg for medical weight loss.  Denies side effects.     She has lost a total of 40 lbs since starting the program 11/27/21 and a total of 26 lbs since starting Wegovy ,    Wegovy  approved through December 31, 2023.     Previous pharmacotherapy for medical weight loss:   -She stopped Phentermine  due to side effects of tachycardia (12/19/21)  -She stopped Zepbound  due to side effects of nausea, diarrhea and constipation.   -Unable to take Wellbutrin due to nausea and vomiting-Avoid Contrave   Bariatric surgery:  Patient  has not had bariatric surgery  Vit D deficiency  She is taking Vit D 50,000 IU weekly.  Denies side effects.  Denies nausea, vomiting or muscle weakness.    Lab Results  Component Value Date   VD25OH 46.6 01/27/2023   VD25OH 29.5 (L) 06/26/2022   VD25OH 27.1 (L) 11/27/2021    Vit B 12 def Taking Vit b12 injections-last injection was 05/15/23.  Denies side effects.   PHYSICAL EXAM:  [place] pressure 128/77, pulse 67, temperature 98.3 F (36.8 C), height 5\' 3"  (1.6 m), weight 190 lb (86.2 kg), last menstrual period 04/16/2023, SpO2 99%. Body mass index is 33.66 kg/m.  General: She is overweight, cooperative, alert, well developed, and in no acute distress. PSYCH: Has normal mood, affect and thought process.   Extremities: No edema.  Neurologic: No gross sensory or motor deficits. No tremors or fasciculations noted.    DIAGNOSTIC DATA REVIEWED:  BMET    Component Value Date/Time   NA 139 01/27/2023 0934   K 5.0 01/27/2023 0934   CL 101 01/27/2023 0934   CO2 23 01/27/2023 0934   GLUCOSE 94 01/27/2023 0934   GLUCOSE 102 (H) 04/12/2013 0937   BUN 7 01/27/2023 0934   CREATININE 0.73 01/27/2023 0934   CREATININE 0.60 04/12/2013 0937   CALCIUM 9.7 01/27/2023 0934   GFRNONAA >89 04/12/2013 0937   GFRAA >  89 04/12/2013 0937   Lab Results  Component Value Date   HGBA1C 5.6 01/27/2023   HGBA1C 6.0 (H) 11/27/2021   Lab Results  Component Value Date   INSULIN  23.6 01/27/2023   INSULIN  23.7 11/27/2021   Lab Results  Component Value Date   TSH 2.690 01/27/2023   CBC    Component Value Date/Time   WBC 9.8 01/27/2023 0934   WBC 7.7 04/12/2013 0937   RBC 4.96 01/27/2023 0934   RBC 4.68 04/12/2013 0937   HGB 13.6 01/27/2023 0934   HCT 42.5 01/27/2023 0934   PLT 432 01/27/2023 0934   MCV 86 01/27/2023 0934   MCH 27.4 01/27/2023 0934   MCH 29.7 04/12/2013 0937   MCHC 32.0 01/27/2023 0934   MCHC 34.2 04/12/2013 0937   RDW 14.0 01/27/2023 0934   Iron Studies No results  found for: "IRON", "TIBC", "FERRITIN", "IRONPCTSAT" Lipid Panel     Component Value Date/Time   CHOL 196 01/27/2023 0934   TRIG 246 (H) 01/27/2023 0934   HDL 49 01/27/2023 0934   CHOLHDL 3.7 04/12/2013 0937   VLDL 25 04/12/2013 0937   LDLCALC 105 (H) 01/27/2023 0934   Hepatic Function Panel     Component Value Date/Time   PROT 7.0 01/27/2023 0934   ALBUMIN 4.4 01/27/2023 0934   AST 15 01/27/2023 0934   ALT 14 01/27/2023 0934   ALKPHOS 113 01/27/2023 0934   BILITOT 0.2 01/27/2023 0934      Component Value Date/Time   TSH 2.690 01/27/2023 0934   Nutritional Lab Results  Component Value Date   VD25OH 46.6 01/27/2023   VD25OH 29.5 (L) 06/26/2022   VD25OH 27.1 (L) 11/27/2021     ASSESSMENT AND PLAN  TREATMENT PLAN FOR OBESITY:  Recommended Dietary Goals  Japji is currently in the action stage of change. As such, her goal is to continue weight management plan. She has agreed to keeping a food journal and adhering to recommended goals of 1600 calories and 100+ grams protein.  Behavioral Intervention  We discussed the following Behavioral Modification Strategies today: increasing lean protein intake to established goals, decreasing simple carbohydrates , increasing vegetables, increasing fiber rich foods, increasing water intake , work on meal planning and preparation, work on tracking and journaling calories using tracking application, and continue to work on maintaining a reduced calorie state, getting the recommended amount of protein, incorporating whole foods, making healthy choices, staying well hydrated and practicing mindfulness when eating..  Additional resources provided today: NA  Recommended Physical Activity Goals  Channell has been advised to work up to 150 minutes of moderate intensity aerobic activity a week and strengthening exercises 2-3 times per week for cardiovascular health, weight loss maintenance and preservation of muscle mass.   She has agreed to  Think about enjoyable ways to increase daily physical activity and overcoming barriers to exercise, Increase physical activity in their day and reduce sedentary time (increase NEAT)., Increase the intensity, frequency or duration of strengthening exercises , and Increase the intensity, frequency or duration of aerobic exercises     Pharmacotherapy We discussed various medication options to help Katey with her weight loss efforts and we both agreed to stop Wegovy  2.4mg  and start Zepbound  5 mg.  Side effects discussed.  Patient's husband has had a vasectomy.    Contraindications:  Pancreatitis (active gallstones) Medullary thyroid  cancer High triglycerides (>500)-will need labs prior to starting Multiple Endocrine Neoplasia syndrome type 2 (MEN 2) Trying to get pregnant Breastfeeding Use with caution with  taking insulin  or sulfonylureas (will need to monitor Fan sugars for hypoglycemia)  Avoid Phentermine  and Qsymia due to side effects to Phentermine -would need to see cardiology and discuss/clearance prior to starting Avoid Contrave due to side effects to Wellbutrin Avoid orlistat due to a Vit D def  ASSOCIATED CONDITIONS ADDRESSED TODAY  Action/Plan  Vitamin D  deficiency -     Vitamin D  (Ergocalciferol ); Take 1 capsule (50,000 Units total) by mouth every 7 (seven) days.  Dispense: 12 capsule; Refill: 0  To take every 2 weeks and will recheck labs next month  Low vitamin B12 level -     Cyanocobalamin ; Inject 1 mL (1,000 mcg total) into the muscle every 30 (thirty) days.  Dispense: 1 mL; Refill: 4  Class 1 obesity due to excess calories without serious comorbidity with body mass index (BMI) of 33.0 to 33.9 in adult -     Zepbound ; Inject 5 mg into the skin once a week.  Dispense: 2 mL; Refill: 0     To make appt with cardiology  Will obtain labs in June   Return in about 4 weeks (around 07/22/2023).Aaron Aas She was informed of the importance of frequent follow up visits to maximize her  success with intensive lifestyle modifications for her multiple health conditions.   ATTESTASTION STATEMENTS:  Reviewed by clinician on day of visit: allergies, medications, problem list, medical history, surgical history, family history, social history, and previous encounter notes.      Crist Dominion. Emad Brechtel FNP-C

## 2023-06-24 NOTE — Telephone Encounter (Signed)
 [id number]:[id number];Review Type:Prior Auth;Coverage Start Date:05/25/2023;Coverage End Date:02/19/2024;. Authorization Expiration Date: February 19, 2024.

## 2023-07-07 ENCOUNTER — Encounter (INDEPENDENT_AMBULATORY_CARE_PROVIDER_SITE_OTHER): Payer: Self-pay | Admitting: Physician Assistant

## 2023-07-07 ENCOUNTER — Ambulatory Visit (INDEPENDENT_AMBULATORY_CARE_PROVIDER_SITE_OTHER): Admitting: Audiology

## 2023-07-07 ENCOUNTER — Ambulatory Visit (INDEPENDENT_AMBULATORY_CARE_PROVIDER_SITE_OTHER): Admitting: Physician Assistant

## 2023-07-07 ENCOUNTER — Institutional Professional Consult (permissible substitution) (INDEPENDENT_AMBULATORY_CARE_PROVIDER_SITE_OTHER): Admitting: Otolaryngology

## 2023-07-07 VITALS — BP 114/70 | HR 98 | Ht 63.0 in | Wt 195.0 lb

## 2023-07-07 DIAGNOSIS — Z09 Encounter for follow-up examination after completed treatment for conditions other than malignant neoplasm: Secondary | ICD-10-CM | POA: Diagnosis not present

## 2023-07-07 DIAGNOSIS — Z8669 Personal history of other diseases of the nervous system and sense organs: Secondary | ICD-10-CM

## 2023-07-07 DIAGNOSIS — Z0110 Encounter for examination of ears and hearing without abnormal findings: Secondary | ICD-10-CM

## 2023-07-07 DIAGNOSIS — H93292 Other abnormal auditory perceptions, left ear: Secondary | ICD-10-CM

## 2023-07-07 DIAGNOSIS — H6690 Otitis media, unspecified, unspecified ear: Secondary | ICD-10-CM

## 2023-07-07 NOTE — Progress Notes (Signed)
 Dear Dr. Ricke Charleston, Here is my assessment for our mutual patient, Kayla Zimmerman. Thank you for allowing me the opportunity to care for your patient. Please do not hesitate to contact me should you have any other questions. Sincerely, Belma Boxer PA-C  Otolaryngology Clinic Note Referring provider: Dr. Ricke Charleston HPI:  Kayla Zimmerman is a 48 y.o. female kindly referred by Dr. Ricke Charleston   The patient is a 48 year old female seen in our office for follow-up evaluation of otitis media.  The patient was last seen in the office on 06/23/2023.  Below is a recap of that encounter.  The patient is a 48 year old female seen in our office for evaluation of left-sided otitis media.  The patient notes that in early March she experienced pressure and pain in her left ear.  She notes she went to her primary care provider and was told that she had yellow fluid in her left ear.  She was diagnosed with otitis media and started on Augmentin .  She notes that this did not improve her symptoms, she was given a round of Levaquin , she notes after taking Levaquin  her symptoms slightly improved but did not go away, she was given another round of Levaquin  which again did not significantly improve her symptoms.  She notes over the last month or so symptoms have slowly gotten better over time.  She notes some fullness in the left ear, she denies any severe pain.  She notes that when the dizziness started it was after the first course of Augmentin , she notes that she felt slightly off balance, this has gone away completely.  She denies any associated hearing loss, ringing, nausea or vomiting.  She denies any significant past medical history of otitis media other than approximately 20 years ago.  She denies any issues with ear infections as a child, no baseline hearing deficits, no trauma to the ear, no head or neck surgeries.  She denied any associated URI symptoms when the symptoms presented.  She notes she is on Wegovy  for diabetes and weight  loss and is lost approximately 60 pounds.  No issues with the Nazario sugar presently.  She notes some minimal seasonal allergy for which she takes Zyrtec or Allegra not currently taking anything.   Update 07/07/2023.  Since her last office visit she notes complete resolution of her symptoms, she is no longer having any ear pain or pressure, no hearing loss.  He has been using the Zyrtec and Flonase .     Independent Review of Additional Tests or Records:  Audiological evaluation on 07/07/2023  Normal audiological evaluation   PMH/Meds/All/SocHx/FamHx/ROS:   Past Medical History:  Diagnosis Date   Arrhythmia 2002   Mitral valve prolapse   Back pain    Chronic back pain    Depression    Ganglion of joint    Migraines    MVP (mitral valve prolapse)      Past Surgical History:  Procedure Laterality Date   WISDOM TOOTH EXTRACTION      Family History  Problem Relation Age of Onset   Cancer Mother        leukemia   Mitral valve prolapse Mother    Arrhythmia Mother    Cancer Father        colon cancer   Breast cancer Maternal Aunt    Breast cancer Paternal Aunt    Mitral valve prolapse Maternal Grandfather    Arrhythmia Maternal Grandfather    Cancer Paternal Grandfather        skin,  prostate   Diabetes Paternal Grandfather    Hypertension Paternal Grandfather      Social Connections: Not on file      Current Outpatient Medications:    B-D 3CC LUER-LOK SYR 25GX1" 25G X 1" 3 ML MISC, , Disp: , Rfl:    Cetirizine-Pseudoephedrine (ZYRTEC-D PO), Take by mouth as needed., Disp: , Rfl:    cyanocobalamin  (VITAMIN B12) 1000 MCG/ML injection, Inject 1 mL (1,000 mcg total) into the muscle every 30 (thirty) days., Disp: 1 mL, Rfl: 4   escitalopram  (LEXAPRO ) 10 MG tablet, Take 1 tablet (10 mg total) by mouth daily., Disp: 90 tablet, Rfl: 3   fluticasone  (FLONASE ) 50 MCG/ACT nasal spray, Place 2 sprays into both nostrils daily., Disp: 16 g, Rfl: 6   loratadine  (CLARITIN ) 10 MG  tablet, Take 1 tablet (10 mg total) by mouth daily., Disp: 30 tablet, Rfl: 11   Multiple Vitamin (MULTI-VITAMINS) TABS, Take 1 tablet by mouth daily., Disp: , Rfl:    NEEDLE, DISP, 25 G (B-D DISP NEEDLE 25GX1") 25G X 1" MISC, Take as directed with Vit B12, Disp: 10 each, Rfl: 0   tirzepatide  (ZEPBOUND ) 5 MG/0.5ML Pen, Inject 5 mg into the skin once a week., Disp: 2 mL, Rfl: 0   Vitamin D , Ergocalciferol , (DRISDOL ) 1.25 MG (50000 UNIT) CAPS capsule, Take 1 capsule (50,000 Units total) by mouth every 7 (seven) days., Disp: 12 capsule, Rfl: 0   Physical Exam:   BP 114/70 (BP Location: Left Arm, Patient Position: Sitting, Cuff Size: Large)   Pulse 98   Ht 5\' 3"  (1.6 m)   Wt 195 lb (88.5 kg)   SpO2 99%   BMI 34.54 kg/m   Pertinent Findings  CN II-XII intact Bilateral EAC clear and TM intact with well pneumatized middle ear spaces Weber 512: equal Rinne 512: AC > BC b/l  No obviously  neck masses/lymphadenopathy/thyromegaly No respiratory distress or stridor  Seprately Identifiable Procedures:  None  Impression & Plans:  Kayla Zimmerman is a 48 y.o. female with the following   Otitis media-  48 year old female seen in our office for follow-up evaluation of otitis media.  She has complete resolution of her symptoms.  She has normal audiological evaluation today.  She may follow-up on a as needed basis.  She verbalized understanding and agreement to today's plan.   - f/u prn   Thank you for allowing me the opportunity to care for your patient. Please do not hesitate to contact me should you have any other questions.  Sincerely, Belma Boxer PA-C [facility] Phone: [phone] Fax: [phone]  07/07/2023, 2:11 PM

## 2023-07-07 NOTE — Progress Notes (Signed)
  [address] [place], [place] [address] [phone]  Audiological Evaluation    Name: Kayla Zimmerman     DOB:   September 02, 1975      MRN:   [id number]                                                                                     Service Date: 07/07/2023     Accompanied by: unaccompanied   Patient comes today after Dr. Lydia Sams, ENT sent a referral for a hearing evaluation due to concerns with recurrent ear infections.   Symptoms Yes Details  Hearing loss  []    Tinnitus  []    Ear pain/ infections/pressure  [x]  Left ear infection- onset March 2025 - patient says that today it seems to be better  Balance problems  []    Noise exposure history  []    Previous ear surgeries  []    Family history of hearing loss  []    Amplification  []    Other  []      Otoscopy: Right ear: Clear external ear canal and notable landmarks visualized on the tympanic membrane. Left ear:  Clear external ear canal and notable landmarks visualized on the tympanic membrane.  Tympanometry: Right ear: Type A- Normal external ear canal volume with normal middle ear pressure and tympanic membrane compliance. Left ear: Type A- Normal external ear canal volume with normal middle ear pressure and tympanic membrane compliance.    Pure tone Audiometry:  Normal hearing from [phone], in both ears.     Speech Audiometry: Right ear- Speech Reception Threshold (SRT) was obtained at 5 dBHL. Left ear-Speech Reception Threshold (SRT) was obtained at 5 dBHL.   Word Recognition Score Tested using NU-6 (MLV) Right ear: 96% was obtained at a presentation level of 50 dBHL with contralateral masking which is deemed as  excellent. Left ear: 96% was obtained at a presentation level of 50 dBHL with contralateral masking which is deemed as  excellent.   The hearing test results were completed under headphones and results are deemed to be of good reliability. Test technique:  conventional       Recommendations: Follow up with ENT as scheduled for today. Return for a hearing evaluation if concerns with hearing changes arise or per MD recommendation.   Sandee Bernath MARIE LEROUX-MARTINEZ, AUD

## 2023-07-14 ENCOUNTER — Encounter: Payer: Self-pay | Admitting: Audiology

## 2023-07-14 ENCOUNTER — Other Ambulatory Visit: Payer: Self-pay | Admitting: Nurse Practitioner

## 2023-07-14 DIAGNOSIS — Z1231 Encounter for screening mammogram for malignant neoplasm of breast: Secondary | ICD-10-CM

## 2023-07-22 ENCOUNTER — Encounter: Payer: Self-pay | Admitting: Nurse Practitioner

## 2023-07-22 ENCOUNTER — Ambulatory Visit: Admitting: Nurse Practitioner

## 2023-07-22 VITALS — BP 120/76 | HR 71 | Temp 98.3°F | Ht 63.0 in | Wt 191.0 lb

## 2023-07-22 DIAGNOSIS — Z6833 Body mass index (BMI) 33.0-33.9, adult: Secondary | ICD-10-CM

## 2023-07-22 DIAGNOSIS — E538 Deficiency of other specified B group vitamins: Secondary | ICD-10-CM

## 2023-07-22 DIAGNOSIS — E559 Vitamin D deficiency, unspecified: Secondary | ICD-10-CM

## 2023-07-22 DIAGNOSIS — E785 Hyperlipidemia, unspecified: Secondary | ICD-10-CM | POA: Diagnosis not present

## 2023-07-22 DIAGNOSIS — Z79899 Other long term (current) drug therapy: Secondary | ICD-10-CM

## 2023-07-22 DIAGNOSIS — E6609 Other obesity due to excess calories: Secondary | ICD-10-CM

## 2023-07-22 DIAGNOSIS — K219 Gastro-esophageal reflux disease without esophagitis: Secondary | ICD-10-CM | POA: Diagnosis not present

## 2023-07-22 DIAGNOSIS — E66811 Obesity, class 1: Secondary | ICD-10-CM

## 2023-07-22 MED ORDER — OMEPRAZOLE 20 MG PO CPDR: 20.0000 mg | DELAYED_RELEASE_CAPSULE | Freq: Every day | ORAL | 0 refills | Status: DC

## 2023-07-22 NOTE — Progress Notes (Signed)
 Office: [phone]  /  Fax: [phone]  WEIGHT SUMMARY AND BIOMETRICS  Weight Lost Since Last Visit: 0lb  Weight Gained Since Last Visit: 1lb   Vitals Temp: 98.3 F (36.8 C) BP: 120/76 Pulse Rate: 71 SpO2: 98 %   Anthropometric Measurements Height: 5' 3 (1.6 m) Weight: 191 lb (86.6 kg) BMI (Calculated): 33.84 Weight at Last Visit: 190lb Weight Lost Since Last Visit: 0lb Weight Gained Since Last Visit: 1lb Starting Weight: 230lb Total Weight Loss (lbs): 39 lb (17.7 kg)   Body Composition  Body Fat %: 39.7 % Fat Mass (lbs): 76 lbs Muscle Mass (lbs): 109.4 lbs Total Body Water (lbs): 80.8 lbs Visceral Fat Rating : 10   Other Clinical Data Fasting: Yes Labs: Yes Today's Visit #: 22 Starting Date: 11/27/21     HPI  Chief Complaint: OBESITY  Kayla Zimmerman is here to discuss her progress with her obesity treatment plan. She is on the the Category 3 Plan and states she is following her eating plan approximately 80 % of the time. She states she is exercising 30-45 minutes 5 days per week.   Interval History:  Since last office visit she has gained 1 pound.   Her highest weight was 243 lbs.     Pharmacotherapy for weight loss: She is currently taking Zepbound  5 mg (x 3 injections) for medical weight loss.  Reports side effects of nausea and reflux the day after the injection.     She has lost a total of 39 lbs since starting the program 11/27/21 and a total of 25 lbs since starting Wegovy ,    Wegovy  approved through December 31, 2023.     Previous pharmacotherapy for medical weight loss:   -She stopped Phentermine  due to side effects of tachycardia (12/19/21)  -She stopped Zepbound  due to side effects of nausea, diarrhea and constipation.   -Unable to take Wellbutrin due to nausea and vomiting-Avoid Contrave   Bariatric surgery:  Patient has not had bariatric surgery   Vit D deficiency  She is taking Vit D 50,000 IU weekly.  Denies side effects.  Denies  nausea, vomiting or muscle weakness.    Lab Results  Component Value Date   VD25OH 46.6 01/27/2023   VD25OH 29.5 (L) 06/26/2022   VD25OH 27.1 (L) 11/27/2021     Vit B12 def taking monthly Vit b12 injections.  Denies side effects   Hyperlipidemia Medication(s): None.    Lab Results  Component Value Date   CHOL 196 01/27/2023   HDL 49 01/27/2023   LDLCALC 105 (H) 01/27/2023   TRIG 246 (H) 01/27/2023   CHOLHDL 3.7 04/12/2013   Lab Results  Component Value Date   ALT 14 01/27/2023   AST 15 01/27/2023   ALKPHOS 113 01/27/2023   BILITOT 0.2 01/27/2023   The 10-year ASCVD risk score (Arnett DK, et al., 2019) is: 1%   Values used to calculate the score:     Age: 48 years     Clincally relevant sex: Female     Is Non-Hispanic African American: No     Diabetic: No     Tobacco smoker: No     Systolic [place] Pressure: 120 mmHg     Is BP treated: No     HDL Cholesterol: 49 mg/dL     Total Cholesterol: 196 mg/dL   PHYSICAL EXAM:  [place] pressure 120/76, pulse 71, temperature 98.3 F (36.8 C), height 5' 3 (1.6 m), weight 191 lb (86.6 kg), last menstrual period 07/09/2023, SpO2 98%.  Body mass index is 33.83 kg/m.  General: She is overweight, cooperative, alert, well developed, and in no acute distress. PSYCH: Has normal mood, affect and thought process.   Extremities: No edema.  Neurologic: No gross sensory or motor deficits. No tremors or fasciculations noted.    DIAGNOSTIC DATA REVIEWED:  BMET    Component Value Date/Time   NA 139 01/27/2023 0934   K 5.0 01/27/2023 0934   CL 101 01/27/2023 0934   CO2 23 01/27/2023 0934   GLUCOSE 94 01/27/2023 0934   GLUCOSE 102 (H) 04/12/2013 0937   BUN 7 01/27/2023 0934   CREATININE 0.73 01/27/2023 0934   CREATININE 0.60 04/12/2013 0937   CALCIUM 9.7 01/27/2023 0934   GFRNONAA >89 04/12/2013 0937   GFRAA >89 04/12/2013 0937   Lab Results  Component Value Date   HGBA1C 5.6 01/27/2023   HGBA1C 6.0 (H) 11/27/2021   Lab  Results  Component Value Date   INSULIN  23.6 01/27/2023   INSULIN  23.7 11/27/2021   Lab Results  Component Value Date   TSH 2.690 01/27/2023   CBC    Component Value Date/Time   WBC 9.8 01/27/2023 0934   WBC 7.7 04/12/2013 0937   RBC 4.96 01/27/2023 0934   RBC 4.68 04/12/2013 0937   HGB 13.6 01/27/2023 0934   HCT 42.5 01/27/2023 0934   PLT 432 01/27/2023 0934   MCV 86 01/27/2023 0934   MCH 27.4 01/27/2023 0934   MCH 29.7 04/12/2013 0937   MCHC 32.0 01/27/2023 0934   MCHC 34.2 04/12/2013 0937   RDW 14.0 01/27/2023 0934   Iron Studies No results found for: IRON, TIBC, FERRITIN, IRONPCTSAT Lipid Panel     Component Value Date/Time   CHOL 196 01/27/2023 0934   TRIG 246 (H) 01/27/2023 0934   HDL 49 01/27/2023 0934   CHOLHDL 3.7 04/12/2013 0937   VLDL 25 04/12/2013 0937   LDLCALC 105 (H) 01/27/2023 0934   Hepatic Function Panel     Component Value Date/Time   PROT 7.0 01/27/2023 0934   ALBUMIN 4.4 01/27/2023 0934   AST 15 01/27/2023 0934   ALT 14 01/27/2023 0934   ALKPHOS 113 01/27/2023 0934   BILITOT 0.2 01/27/2023 0934      Component Value Date/Time   TSH 2.690 01/27/2023 0934   Nutritional Lab Results  Component Value Date   VD25OH 46.6 01/27/2023   VD25OH 29.5 (L) 06/26/2022   VD25OH 27.1 (L) 11/27/2021     ASSESSMENT AND PLAN  TREATMENT PLAN FOR OBESITY:  Recommended Dietary Goals  Kayla Zimmerman is currently in the action stage of change. As such, her goal is to continue weight management plan. She has agreed to the Category 4 Plan.  Behavioral Intervention  We discussed the following Behavioral Modification Strategies today: increasing lean protein intake to established goals, decreasing simple carbohydrates , increasing vegetables, increasing fiber rich foods, increasing water intake , and continue to work on maintaining a reduced calorie state, getting the recommended amount of protein, incorporating whole foods, making healthy choices,  staying well hydrated and practicing mindfulness when eating..  Additional resources provided today: NA  Recommended Physical Activity Goals  Kayla Zimmerman has been advised to work up to 150 minutes of moderate intensity aerobic activity a week and strengthening exercises 2-3 times per week for cardiovascular health, weight loss maintenance and preservation of muscle mass.   She has agreed to Think about enjoyable ways to increase daily physical activity and overcoming barriers to exercise, Increase physical activity in their day and  reduce sedentary time (increase NEAT)., and continue to gradually increase the amount and intensity of exercise routine   Pharmacotherapy We discussed various medication options to help Kayla Zimmerman with her weight loss efforts and we both agreed to continue Zepbound  5mg . Side effects discussed.  Avoid Phentermine  and Qsymia due to side effects to Phentermine -would need to see cardiology and discuss/clearance prior to starting Avoid Contrave due to side effects to Wellbutrin Avoid orlistat due to a Vit D def    ASSOCIATED CONDITIONS ADDRESSED TODAY  Action/Plan  Gastroesophageal reflux disease, unspecified whether esophagitis present -     Omeprazole; Take 1 capsule (20 mg total) by mouth daily.  Dispense: 30 capsule; Refill: 0  Will take day of and next day after injection.  To send me a message next week to let me know how she is doing.    May need to switch back to Wegovy .  Will continue to monitor.    Vitamin D  deficiency -     VITAMIN D  25 Hydroxy (Vit-D Deficiency, Fractures)  Vitamin B12 deficiency -     Vitamin B12  Hyperlipidemia, unspecified hyperlipidemia type -     Lipid Panel With LDL/HDL Ratio  Medication management -     Comprehensive metabolic panel with GFR  Class 1 obesity due to excess calories with body mass index (BMI) of 33.0 to 33.9 in adult, unspecified whether serious comorbidity present       Return in about 4 weeks (around  08/19/2023).Aaron Aas She was informed of the importance of frequent follow up visits to maximize her success with intensive lifestyle modifications for her multiple health conditions.   ATTESTASTION STATEMENTS:  Reviewed by clinician on day of visit: allergies, medications, problem list, medical history, surgical history, family history, social history, and previous encounter notes.     Crist Dominion. Breya Cass FNP-C

## 2023-07-23 LAB — LIPID PANEL WITH LDL/HDL RATIO
Cholesterol, Total: 201 mg/dL — ABNORMAL HIGH (ref 100–199)
HDL: 52 mg/dL (ref >39)
LDL Chol Calc (NIH): 131 mg/dL — ABNORMAL HIGH (ref 0–99)
LDL/HDL Ratio: 2.5 ratio (ref 0.0–3.2)
Triglycerides: 101 mg/dL (ref 0–149)
VLDL Cholesterol Cal: 18 mg/dL (ref 5–40)

## 2023-07-23 LAB — COMPREHENSIVE METABOLIC PANEL WITH GFR
ALT: 14 IU/L (ref 0–32)
AST: 16 IU/L (ref 0–40)
Albumin: 4.5 g/dL (ref 3.9–4.9)
Alkaline Phosphatase: 102 IU/L (ref 44–121)
BUN/Creatinine Ratio: 13 (ref 9–23)
BUN: 9 mg/dL (ref 6–24)
Bilirubin Total: 0.2 mg/dL (ref 0.0–1.2)
CO2: 19 mmol/L — ABNORMAL LOW (ref 20–29)
Calcium: 9.7 mg/dL (ref 8.7–10.2)
Chloride: 103 mmol/L (ref 96–106)
Creatinine, Ser: 0.71 mg/dL (ref 0.57–1.00)
Globulin, Total: 2.4 g/dL (ref 1.5–4.5)
Glucose: 96 mg/dL (ref 70–99)
Potassium: 4.5 mmol/L (ref 3.5–5.2)
Sodium: 140 mmol/L (ref 134–144)
Total Protein: 6.9 g/dL (ref 6.0–8.5)
eGFR: 105 mL/min/{1.73_m2} (ref >59)

## 2023-07-23 LAB — VITAMIN B12: Vitamin B-12: 534 pg/mL (ref 232–1245)

## 2023-07-23 LAB — VITAMIN D 25 HYDROXY (VIT D DEFICIENCY, FRACTURES): Vit D, 25-Hydroxy: 47.9 ng/mL (ref 30.0–100.0)

## 2023-07-26 ENCOUNTER — Encounter: Payer: Self-pay | Admitting: Nurse Practitioner

## 2023-07-28 ENCOUNTER — Other Ambulatory Visit: Payer: Self-pay | Admitting: Nurse Practitioner

## 2023-07-29 ENCOUNTER — Other Ambulatory Visit: Payer: Self-pay | Admitting: Nurse Practitioner

## 2023-07-29 DIAGNOSIS — E66811 Obesity, class 1: Secondary | ICD-10-CM

## 2023-07-29 DIAGNOSIS — R7989 Other specified abnormal findings of blood chemistry: Secondary | ICD-10-CM

## 2023-07-29 MED ORDER — CYANOCOBALAMIN 1000 MCG/ML IJ SOLN: 1000.0000 ug | INTRAMUSCULAR | 2 refills | Status: DC

## 2023-07-29 MED ORDER — ZEPBOUND 7.5 MG/0.5ML ~~LOC~~ SOAJ: 7.5000 mg | SUBCUTANEOUS | 0 refills | Status: DC

## 2023-08-19 ENCOUNTER — Ambulatory Visit

## 2023-08-19 ENCOUNTER — Ambulatory Visit
Admission: RE | Admit: 2023-08-19 | Discharge: 2023-08-19 | Disposition: A | Discharge status: Home / Self Care | Source: Ambulatory Visit | Attending: Nurse Practitioner | Admitting: Nurse Practitioner

## 2023-08-19 DIAGNOSIS — Z1231 Encounter for screening mammogram for malignant neoplasm of breast: Secondary | ICD-10-CM | POA: Diagnosis not present

## 2023-08-21 ENCOUNTER — Encounter: Payer: Self-pay | Admitting: Nurse Practitioner

## 2023-08-21 ENCOUNTER — Ambulatory Visit: Admitting: Nurse Practitioner

## 2023-08-21 VITALS — BP 116/75 | HR 76 | Temp 98.1°F | Ht 63.0 in | Wt 192.0 lb

## 2023-08-21 DIAGNOSIS — E559 Vitamin D deficiency, unspecified: Secondary | ICD-10-CM

## 2023-08-21 DIAGNOSIS — E785 Hyperlipidemia, unspecified: Secondary | ICD-10-CM

## 2023-08-21 DIAGNOSIS — Z6834 Body mass index (BMI) 34.0-34.9, adult: Secondary | ICD-10-CM | POA: Diagnosis not present

## 2023-08-21 DIAGNOSIS — E66811 Obesity, class 1: Secondary | ICD-10-CM

## 2023-08-21 MED ORDER — ZEPBOUND 10 MG/0.5ML ~~LOC~~ SOAJ: 10.0000 mg | SUBCUTANEOUS | 0 refills | Status: DC

## 2023-08-21 NOTE — Progress Notes (Signed)
 Office: [phone]  /  Fax: [phone]  WEIGHT SUMMARY AND BIOMETRICS  Weight Lost Since Last Visit: 0lb  Weight Gained Since Last Visit: 1lb   Vitals Temp: 98.1 F (36.7 C) BP: 116/75 Pulse Rate: 76 SpO2: 97 %   Anthropometric Measurements Height: 5' 3 (1.6 m) Weight: 192 lb (87.1 kg) BMI (Calculated): 34.02 Weight at Last Visit: 191lb Weight Lost Since Last Visit: 0lb Weight Gained Since Last Visit: 1lb Starting Weight: 230lb Total Weight Loss (lbs): 38 lb (17.2 kg)   Body Composition  Body Fat %: 39.5 % Fat Mass (lbs): 76 lbs Muscle Mass (lbs): 110.6 lbs Total Body Water (lbs): 82.6 lbs Visceral Fat Rating : 10   Other Clinical Data Fasting: No Labs: No Today's Visit #: 23 Starting Date: 11/27/21     HPI  Chief Complaint: OBESITY  Kayla Zimmerman is here to discuss her progress with her obesity treatment plan. She is on the the Category 3 Plan and states she is following her eating plan approximately 70 % of the time. She states she is exercising 60 minutes 3 days per week.   Interval History:  Since last office visit she has gained 1 pound.  She has been under some stress at home and has been stress eating and not sleep well.  She notes that her stress should be decreasing soon and will be able to get on track.  Her weight increased to 195 lbs since her last visit and today was 192 lbs.  She went on vacation since her last visit.    Her highest weight was 243 lbs.     Pharmacotherapy for weight loss: She is currently taking Zepbound  7.5 mg for medical weight loss.  Reports side effects of nausea and reflux the day of and the day after the injection.  She is taking prilosec the night before and the day of injection and notes it has been beneficial.     She has lost a total of 38 lbs since starting the program 11/27/21    Previous pharmacotherapy for medical weight loss:   -She stopped Phentermine  due to side effects of tachycardia (12/19/21)  -She  stopped Zepbound  due to side effects of nausea, diarrhea and constipation.   -Unable to take Wellbutrin due to nausea and vomiting-Avoid Contrave   Bariatric surgery:  Patient has not had bariatric surgery  Vit D deficiency  She is taking Vit D 50,000 IU every other week.  Denies side effects.  Denies nausea, vomiting or muscle weakness.    Lab Results  Component Value Date   VD25OH 47.9 07/22/2023   VD25OH 46.6 01/27/2023   VD25OH 29.5 (L) 06/26/2022    Hyperlipidemia Medication(s): none.   Lab Results  Component Value Date   CHOL 201 (H) 07/22/2023   HDL 52 07/22/2023   LDLCALC 131 (H) 07/22/2023   TRIG 101 07/22/2023   CHOLHDL 3.7 04/12/2013   Lab Results  Component Value Date   ALT 14 07/22/2023   AST 16 07/22/2023   ALKPHOS 102 07/22/2023   BILITOT 0.2 07/22/2023   The 10-year ASCVD risk score (Arnett DK, et al., 2019) is: 0.9%   Values used to calculate the score:     Age: 48 years     Clincally relevant sex: Female     Is Non-Hispanic African American: No     Diabetic: No     Tobacco smoker: No     Systolic Mcclellan Pressure: 116 mmHg     Is BP treated: No  HDL Cholesterol: 52 mg/dL     Total Cholesterol: 201 mg/dL   PHYSICAL EXAM:  Glockner pressure 116/75, pulse 76, temperature 98.1 F (36.7 C), height 5' 3 (1.6 m), weight 192 lb (87.1 kg), last menstrual period 08/16/2023, SpO2 97%. Body mass index is 34.01 kg/m.  General: She is overweight, cooperative, alert, well developed, and in no acute distress. PSYCH: Has normal mood, affect and thought process.   Extremities: No edema.  Neurologic: No gross sensory or motor deficits. No tremors or fasciculations noted.    DIAGNOSTIC DATA REVIEWED:  BMET    Component Value Date/Time   NA 140 07/22/2023 0939   K 4.5 07/22/2023 0939   CL 103 07/22/2023 0939   CO2 19 (L) 07/22/2023 0939   GLUCOSE 96 07/22/2023 0939   GLUCOSE 102 (H) 04/12/2013 0937   BUN 9 07/22/2023 0939   CREATININE 0.71 07/22/2023  0939   CREATININE 0.60 04/12/2013 0937   CALCIUM 9.7 07/22/2023 0939   GFRNONAA >89 04/12/2013 0937   GFRAA >89 04/12/2013 0937   Lab Results  Component Value Date   HGBA1C 5.6 01/27/2023   HGBA1C 6.0 (H) 11/27/2021   Lab Results  Component Value Date   INSULIN  23.6 01/27/2023   INSULIN  23.7 11/27/2021   Lab Results  Component Value Date   TSH 2.690 01/27/2023   CBC    Component Value Date/Time   WBC 9.8 01/27/2023 0934   WBC 7.7 04/12/2013 0937   RBC 4.96 01/27/2023 0934   RBC 4.68 04/12/2013 0937   HGB 13.6 01/27/2023 0934   HCT 42.5 01/27/2023 0934   PLT 432 01/27/2023 0934   MCV 86 01/27/2023 0934   MCH 27.4 01/27/2023 0934   MCH 29.7 04/12/2013 0937   MCHC 32.0 01/27/2023 0934   MCHC 34.2 04/12/2013 0937   RDW 14.0 01/27/2023 0934   Iron Studies No results found for: IRON, TIBC, FERRITIN, IRONPCTSAT Lipid Panel     Component Value Date/Time   CHOL 201 (H) 07/22/2023 0939   TRIG 101 07/22/2023 0939   HDL 52 07/22/2023 0939   CHOLHDL 3.7 04/12/2013 0937   VLDL 25 04/12/2013 0937   LDLCALC 131 (H) 07/22/2023 0939   Hepatic Function Panel     Component Value Date/Time   PROT 6.9 07/22/2023 0939   ALBUMIN 4.5 07/22/2023 0939   AST 16 07/22/2023 0939   ALT 14 07/22/2023 0939   ALKPHOS 102 07/22/2023 0939   BILITOT 0.2 07/22/2023 0939      Component Value Date/Time   TSH 2.690 01/27/2023 0934   Nutritional Lab Results  Component Value Date   VD25OH 47.9 07/22/2023   VD25OH 46.6 01/27/2023   VD25OH 29.5 (L) 06/26/2022     ASSESSMENT AND PLAN  TREATMENT PLAN FOR OBESITY:  Recommended Dietary Goals  Kayla Zimmerman is currently in the action stage of change. As such, her goal is to continue weight management plan. She has agreed to the Category 3 Plan.  Behavioral Intervention  We discussed the following Behavioral Modification Strategies today: increasing lean protein intake to established goals, decreasing simple carbohydrates , increasing  vegetables, increasing fiber rich foods, increasing water intake , and continue to work on maintaining a reduced calorie state, getting the recommended amount of protein, incorporating whole foods, making healthy choices, staying well hydrated and practicing mindfulness when eating..  Additional resources provided today: NA  Recommended Physical Activity Goals  Ragena has been advised to work up to 150 minutes of moderate intensity aerobic activity a week and strengthening  exercises 2-3 times per week for cardiovascular health, weight loss maintenance and preservation of muscle mass.   She has agreed to Think about enjoyable ways to increase daily physical activity and overcoming barriers to exercise, Increase physical activity in their day and reduce sedentary time (increase NEAT)., and continue to gradually increase the amount and intensity of exercise routine   Pharmacotherapy We discussed various medication options to help Sahira with her weight loss efforts and we both agreed to increase Zepbound  10mg . Side effects discussed.  -Avoid Phentermine  and Qsymia due to side effects to Phentermine -would need to see cardiology and discuss/clearance prior to starting -Avoid Contrave due to side effects to Wellbutrin -Avoid orlistat due to a Vit D def  ASSOCIATED CONDITIONS ADDRESSED TODAY  Action/Plan  Vitamin D  deficiency Continue as directed  Hyperlipidemia, unspecified hyperlipidemia type Worsening  Cardiovascular risk and specific lipid/LDL goals reviewed.  We discussed several lifestyle modifications today and Amarea will continue to work on diet, exercise and weight loss efforts. Orders and follow up as documented in patient record.   Counseling Intensive lifestyle modifications are the first line treatment for this issue. Dietary changes: Increase soluble fiber. Decrease simple carbohydrates. Exercise changes: Moderate to vigorous-intensity aerobic activity 150 minutes per week if  tolerated. Lipid-lowering medications: see documented in medical record.   Class 1 obesity due to excess calories with serious comorbidity and body mass index (BMI) of 34.0 to 34.9 in adult -     Zepbound ; Inject 10 mg into the skin once a week.  Dispense: 2 mL; Refill: 0      Labs reviewed in chart with patient from 07/22/23   Return in about 4 weeks (around 09/18/2023).[name] She was informed of the importance of frequent follow up visits to maximize her success with intensive lifestyle modifications for her multiple health conditions.   ATTESTASTION STATEMENTS:  Reviewed by clinician on day of visit: allergies, medications, problem list, medical history, surgical history, family history, social history, and previous encounter notes.     Corean SAUNDERS. Talina Pleitez FNP-C

## 2023-08-30 ENCOUNTER — Other Ambulatory Visit: Payer: Self-pay | Admitting: Nurse Practitioner

## 2023-08-30 DIAGNOSIS — K219 Gastro-esophageal reflux disease without esophagitis: Secondary | ICD-10-CM

## 2023-09-02 ENCOUNTER — Encounter: Payer: Self-pay | Admitting: Nurse Practitioner

## 2023-09-16 NOTE — Progress Notes (Deleted)
 Cardiology Office Note:    Date:  09/16/2023   ID:  Niels CROME Oates, DOB October 15, 1975, MRN [id number]  PCP:  Nedra Tinnie LABOR, NP  Cardiologist:  None { Click to update primary MD,subspecialty MD or APP then REFRESH:1}    Referring MD: Nedra Tinnie LABOR, NP   Chief Complaint: routine follow-up of mitral valve prolapse  History of Present Illness:    Jo Booze Siddiqi is a 48 y.o. female with a self reported history of mitral valve prolapse (not seen on Echo in 04/2022), migraines, chronic back pain, and depression who is followed by Dr. Barbaraann and presents today for routine follow-up.   Patient was referred to Dr. Barbaraann in 02/2022 for further evaluation of mitral valve prolapse which she reported being diagnosed with in her 35s. She also reported an isolated syncopal episode the month prior in the setting of influenza and poor PO intake. She was also on Phentermine  at the the time for weight loss. Episode sounded vasovagal in nature and no additional work-up was felt to be necessary. Echo was ordered for further evaluation of mitral valve prolapse and showed LVEF of 65-70% with normal wall motion and diastolic function, normal RV function, and no significant valvular disease. There was no evidence of mitral valve prolapse.   Patient presents today for follow-up. ***  History of Mitral Valve Prolapse Patient reports being diagnosed with mitral valve prolapse in her 20s. Echo in 04/2022 showed normal LV function with no evidence of mitral valve prolapse and only trivial MR.   History of Syncope Isolated episode of syncope in 01/2022 in setting of influenza and poor PO intake. Felt to be vasovagal in nature.  - No recurrence.  - No additional work-up necessary.   EKGs/Labs/Other Studies Reviewed:    The following studies were reviewed:  Echocardiogram 04/12/2022: Impressions: 1. Left ventricular ejection fraction, by estimation, is 65 to 70%. The  left ventricle has normal function. The left  ventricle has no regional  wall motion abnormalities. Left ventricular diastolic parameters were  normal.   2. Right ventricular systolic function is normal. The right ventricular  size is normal. Tricuspid regurgitation signal is inadequate for assessing  PA pressure.   3. The mitral valve is normal in structure. Trivial mitral valve  regurgitation. No evidence of mitral stenosis.   4. The aortic valve was not well visualized. Aortic valve regurgitation  is not visualized. No aortic stenosis is present.   5. The inferior vena cava is normal in size with greater than 50%  respiratory variability, suggesting right atrial pressure of 3 mmHg.    EKG:  EKG ordered today. EKG personally reviewed and demonstrates ***.  Recent Labs: 01/27/2023: Hemoglobin 13.6; Platelets 432; TSH 2.690 07/22/2023: ALT 14; BUN 9; Creatinine, Ser 0.71; Potassium 4.5; Sodium 140  Recent Lipid Panel    Component Value Date/Time   CHOL 201 (H) 07/22/2023 0939   TRIG 101 07/22/2023 0939   HDL 52 07/22/2023 0939   CHOLHDL 3.7 04/12/2013 0937   VLDL 25 04/12/2013 0937   LDLCALC 131 (H) 07/22/2023 0939    Physical Exam:    Vital Signs: There were no vitals taken for this visit.    Wt Readings from Last 3 Encounters:  08/21/23 192 lb (87.1 kg)  07/22/23 191 lb (86.6 kg)  07/07/23 195 lb (88.5 kg)     General: 48 y.o. female in no acute distress. HEENT: Normocephalic and atraumatic. Sclera clear.  Neck: Supple. No carotid bruits. No JVD. Heart: ***  RRR. Distinct S1 and S2. No murmurs, gallops, or rubs.  Lungs: No increased work of breathing. Clear to ausculation bilaterally. No wheezes, rhonchi, or rales.  Abdomen: Soft, non-distended, and non-tender to palpation.  Extremities: No lower extremity edema.  Radial and distal pedal pulses 2+ and equal bilaterally. Skin: Warm and dry. Neuro: No focal deficits. Psych: Normal affect. Responds appropriately.   Assessment:    No diagnosis found.  Plan:      Disposition: Follow up in ***   Signed, Aline FORBES Door, PA-C  09/16/2023 8:46 PM    [facility]

## 2023-09-23 ENCOUNTER — Ambulatory Visit: Admitting: Nurse Practitioner

## 2023-09-23 ENCOUNTER — Encounter: Payer: Self-pay | Admitting: Nurse Practitioner

## 2023-09-23 VITALS — BP 112/52 | HR 65 | Temp 98.2°F | Ht 63.0 in | Wt 190.0 lb

## 2023-09-23 DIAGNOSIS — Z6833 Body mass index (BMI) 33.0-33.9, adult: Secondary | ICD-10-CM | POA: Diagnosis not present

## 2023-09-23 DIAGNOSIS — E66811 Obesity, class 1: Secondary | ICD-10-CM

## 2023-09-23 DIAGNOSIS — E785 Hyperlipidemia, unspecified: Secondary | ICD-10-CM

## 2023-09-23 NOTE — Progress Notes (Signed)
 Office: [phone]  /  Fax: [phone]  WEIGHT SUMMARY AND BIOMETRICS  Weight Lost Since Last Visit: 2lb  Weight Gained Since Last Visit: 0lb   Vitals Temp: 98.2 F (36.8 C) BP: (!) 112/52 Pulse Rate: 65 SpO2: 95 %   Anthropometric Measurements Height: 5' 3 (1.6 m) Weight: 190 lb (86.2 kg) BMI (Calculated): 33.67 Weight at Last Visit: 192lb Weight Lost Since Last Visit: 2lb Weight Gained Since Last Visit: 0lb Starting Weight: 230lb Total Weight Loss (lbs): 40 lb (18.1 kg)   Body Composition  Body Fat %: 39.5 % Fat Mass (lbs): 75.2 lbs Muscle Mass (lbs): 109.4 lbs Total Body Water (lbs): 80 lbs Visceral Fat Rating : 10   Other Clinical Data Fasting: No Labs: No Today's Visit #: 24 Starting Date: 11/27/21     HPI  Chief Complaint: OBESITY  Aliza is here to discuss her progress with her obesity treatment plan. She is on the the Category 3 Plan and states she is following her eating plan approximately 80 % of the time. She states she is exercising 0 minutes 0 days per week.   Interval History:  Since last office visit she has lost 2 pounds. She is doing well with her meal plan.  Doing well with polyphagia but struggling with sugar cravings. She is drinking water daily.   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  She is planning to start going to the gym today.    Her highest weight was 243 lbs.     Pharmacotherapy for weight loss: She is currently taking Zepbound  10 mg for medical weight loss.  Reports side effects of nausea and reflux the day of and the day after the injection if she doesn't take Prilosec.  She is taking prilosec the night before and the day of injection and notes it has been beneficial.     She has an appt with cardiology on Monday and is hoping to discuss clearance for Phentermine .     She has lost a total of 40 lbs since starting the program 11/27/21    Previous pharmacotherapy for medical weight loss:    -She stopped Phentermine  due to side effects of tachycardia (12/19/21)  -She stopped Zepbound  due to side effects of nausea, diarrhea and constipation.   -Unable to take Wellbutrin due to nausea and vomiting-Avoid Contrave   Bariatric surgery:  Patient has not had bariatric surgery  Hyperlipidemia Medication(s): None.   Lab Results  Component Value Date   CHOL 201 (H) 07/22/2023   HDL 52 07/22/2023   LDLCALC 131 (H) 07/22/2023   TRIG 101 07/22/2023   CHOLHDL 3.7 04/12/2013   Lab Results  Component Value Date   ALT 14 07/22/2023   AST 16 07/22/2023   ALKPHOS 102 07/22/2023   BILITOT 0.2 07/22/2023   The 10-year ASCVD risk score (Arnett DK, et al., 2019) is: 0.8%   Values used to calculate the score:     Age: 35 years     Clincally relevant sex: Female     Is Non-Hispanic African American: No     Diabetic: No     Tobacco smoker: No     Systolic Magro Pressure: 112 mmHg     Is BP treated: No     HDL Cholesterol: 52 mg/dL     Total Cholesterol: 201 mg/dL    PHYSICAL EXAM:  Stuard pressure (!) 112/52, pulse 65, temperature 98.2 F (36.8 C), height 5' 3 (1.6 m), weight 190 lb (86.2  kg), last menstrual period 09/02/2023, SpO2 95%. Body mass index is 33.66 kg/m.  General: She is overweight, cooperative, alert, well developed, and in no acute distress. PSYCH: Has normal mood, affect and thought process.   Extremities: No edema.  Neurologic: No gross sensory or motor deficits. No tremors or fasciculations noted.    DIAGNOSTIC DATA REVIEWED:  BMET    Component Value Date/Time   NA 140 07/22/2023 0939   K 4.5 07/22/2023 0939   CL 103 07/22/2023 0939   CO2 19 (L) 07/22/2023 0939   GLUCOSE 96 07/22/2023 0939   GLUCOSE 102 (H) 04/12/2013 0937   BUN 9 07/22/2023 0939   CREATININE 0.71 07/22/2023 0939   CREATININE 0.60 04/12/2013 0937   CALCIUM 9.7 07/22/2023 0939   GFRNONAA >89 04/12/2013 0937   GFRAA >89 04/12/2013 0937   Lab Results  Component Value Date    HGBA1C 5.6 01/27/2023   HGBA1C 6.0 (H) 11/27/2021   Lab Results  Component Value Date   INSULIN  23.6 01/27/2023   INSULIN  23.7 11/27/2021   Lab Results  Component Value Date   TSH 2.690 01/27/2023   CBC    Component Value Date/Time   WBC 9.8 01/27/2023 0934   WBC 7.7 04/12/2013 0937   RBC 4.96 01/27/2023 0934   RBC 4.68 04/12/2013 0937   HGB 13.6 01/27/2023 0934   HCT 42.5 01/27/2023 0934   PLT 432 01/27/2023 0934   MCV 86 01/27/2023 0934   MCH 27.4 01/27/2023 0934   MCH 29.7 04/12/2013 0937   MCHC 32.0 01/27/2023 0934   MCHC 34.2 04/12/2013 0937   RDW 14.0 01/27/2023 0934   Iron Studies No results found for: IRON, TIBC, FERRITIN, IRONPCTSAT Lipid Panel     Component Value Date/Time   CHOL 201 (H) 07/22/2023 0939   TRIG 101 07/22/2023 0939   HDL 52 07/22/2023 0939   CHOLHDL 3.7 04/12/2013 0937   VLDL 25 04/12/2013 0937   LDLCALC 131 (H) 07/22/2023 0939   Hepatic Function Panel     Component Value Date/Time   PROT 6.9 07/22/2023 0939   ALBUMIN 4.5 07/22/2023 0939   AST 16 07/22/2023 0939   ALT 14 07/22/2023 0939   ALKPHOS 102 07/22/2023 0939   BILITOT 0.2 07/22/2023 0939      Component Value Date/Time   TSH 2.690 01/27/2023 0934   Nutritional Lab Results  Component Value Date   VD25OH 47.9 07/22/2023   VD25OH 46.6 01/27/2023   VD25OH 29.5 (L) 06/26/2022     ASSESSMENT AND PLAN  TREATMENT PLAN FOR OBESITY:  Recommended Dietary Goals  Dauna is currently in the action stage of change. As such, her goal is to continue weight management plan. She has agreed to the Category 3 Plan.  Behavioral Intervention  We discussed the following Behavioral Modification Strategies today: increasing lean protein intake to established goals, decreasing simple carbohydrates , increasing vegetables, increasing fiber rich foods, increasing water intake , reading food labels , keeping healthy foods at home, and continue to work on maintaining a reduced calorie  state, getting the recommended amount of protein, incorporating whole foods, making healthy choices, staying well hydrated and practicing mindfulness when eating..  Additional resources provided today: NA  Recommended Physical Activity Goals  Ji has been advised to work up to 150 minutes of moderate intensity aerobic activity a week and strengthening exercises 2-3 times per week for cardiovascular health, weight loss maintenance and preservation of muscle mass.   She has agreed to Think about enjoyable ways to  increase daily physical activity and overcoming barriers to exercise, Increase physical activity in their day and reduce sedentary time (increase NEAT)., and Work on scheduling and tracking physical activity.    Pharmacotherapy We discussed various medication options to help Alyra with her weight loss efforts and we both agreed to wait on refilling Zepbound  until she sees cardiology.  Would recommend Lomaira  or Qsymia over Phentermine .  Will contact me next week to let me know cardiology recommendations.    -Avoid Phentermine  and Qsymia due to side effects to Phentermine -would need to see cardiology and discuss/clearance prior to starting -Avoid Contrave due to side effects to Wellbutrin -Avoid orlistat due to a Vit D def  ASSOCIATED CONDITIONS ADDRESSED TODAY  Action/Plan  Hyperlipidemia, unspecified hyperlipidemia type Cardiovascular risk and specific lipid/LDL goals reviewed.  We discussed several lifestyle modifications today and Salsabeel will continue to work on diet, exercise and weight loss efforts. Orders and follow up as documented in patient record.   Counseling Intensive lifestyle modifications are the first line treatment for this issue. Dietary changes: Increase soluble fiber. Decrease simple carbohydrates. Exercise changes: Moderate to vigorous-intensity aerobic activity 150 minutes per week if tolerated. Lipid-lowering medications: see documented in medical record.     Will recheck labs in December   Obesity, Class I, BMI 30-34.9       Will obtain labs in Dec  Return in about 4 weeks (around 10/21/2023).[name] She was informed of the importance of frequent follow up visits to maximize her success with intensive lifestyle modifications for her multiple health conditions.   ATTESTASTION STATEMENTS:  Reviewed by clinician on day of visit: allergies, medications, problem list, medical history, surgical history, family history, social history, and previous encounter notes.   Time spent on visit including pre-visit chart review and post-visit care and charting was 30 minutes. Discussed meal plans, protein intake, water intake, exercise, etc.     Corean R. Winnell Bento FNP-C

## 2023-09-29 ENCOUNTER — Encounter: Payer: Self-pay | Admitting: Nurse Practitioner

## 2023-09-29 ENCOUNTER — Ambulatory Visit: Admitting: Student

## 2023-09-29 ENCOUNTER — Ambulatory Visit: Payer: Self-pay

## 2023-09-29 ENCOUNTER — Ambulatory Visit: Admitting: Nurse Practitioner

## 2023-09-29 VITALS — BP 118/80 | HR 63 | Temp 97.5°F | Ht 63.0 in | Wt 194.2 lb

## 2023-09-29 DIAGNOSIS — M549 Dorsalgia, unspecified: Secondary | ICD-10-CM

## 2023-09-29 DIAGNOSIS — M545 Low back pain, unspecified: Secondary | ICD-10-CM

## 2023-09-29 DIAGNOSIS — G8929 Other chronic pain: Secondary | ICD-10-CM

## 2023-09-29 MED ORDER — KETOROLAC TROMETHAMINE 60 MG/2ML IM SOLN
30.0000 mg | Freq: Once | INTRAMUSCULAR | Status: AC
  Administered 2023-09-29: 30 mg via INTRAMUSCULAR

## 2023-09-29 MED ORDER — CYCLOBENZAPRINE HCL 10 MG PO TABS: 10.0000 mg | ORAL_TABLET | Freq: Three times a day (TID) | ORAL | 0 refills | Status: DC | PRN

## 2023-09-29 MED ORDER — PREDNISONE 20 MG PO TABS: 40.0000 mg | ORAL_TABLET | Freq: Every day | ORAL | 0 refills | Status: DC

## 2023-09-29 NOTE — Patient Instructions (Signed)
 It was great to see you!  Start prednisone  2 tablets daily in the morning with food for 5 days - let me know if you get a rash   Start flexeril  3 times a day as needed for muscle spasms - this may make you sleepy   You can keep taking tylenol  as needed for pain - don't take ibuprofen  until you finish the prednisone    Let's follow-up if symptoms worsen or don't improve   Take care,  Tinnie Harada, NP

## 2023-09-29 NOTE — Progress Notes (Unsigned)
   Acute Office Visit  Subjective:     Patient ID: Kayla Zimmerman, female    DOB: October 02, 1975, 48 y.o.   MRN: [id number]  Chief Complaint  Patient presents with   Back Pain    Lower back pain that started on Saturday    HPI Patient is in today for ***  ROS      Objective:    BP 118/80 (BP Location: Left Arm, Patient Position: Sitting, Cuff Size: Normal)   Pulse 63   Temp (!) 97.5 F (36.4 C)   Ht 5' 3 (1.6 m)   Wt 194 lb 3.2 oz (88.1 kg)   LMP 09/02/2023 (Exact Date)   SpO2 100%   BMI 34.40 kg/m  BP Readings from Last 3 Encounters:  09/29/23 118/80  09/23/23 (!) 112/52  08/21/23 116/75   Wt Readings from Last 3 Encounters:  09/29/23 194 lb 3.2 oz (88.1 kg)  09/23/23 190 lb (86.2 kg)  08/21/23 192 lb (87.1 kg)      Physical Exam  No results found for any visits on 09/29/23.      Assessment & Plan:   Problem List Items Addressed This Visit   None   No orders of the defined types were placed in this encounter.   No follow-ups on file.  Tinnie DELENA Harada, NP

## 2023-09-29 NOTE — Telephone Encounter (Signed)
 FYI Only or Action Required?: FYI only for provider.  Patient was last seen in primary care on 09/23/2023 by Becki Krabbe, FNP.  Called Nurse Triage reporting Back Pain.  Symptoms began several days ago.  Interventions attempted: OTC medications: advil  and tylenol .  Symptoms are: gradually worsening.  Triage Disposition: See HCP Within 4 Hours (Or PCP Triage)  Patient/caregiver understands and will follow disposition?: Yes   Copied from CRM #[id number]. Topic: Clinical - Red Word Triage >> Sep 29, 2023  8:39 AM Kayla Zimmerman wrote: Red Word that prompted transfer to Nurse Triage: Severe back pain ( think it's a pinched nerve) Reason for Disposition  [1] SEVERE back pain (e.g., excruciating, unable to do any normal activities) AND [2] not improved 2 hours after pain medicine  Answer Assessment - Initial Assessment Questions 1. ONSET: When did the pain begin? (e.g., minutes, hours, days)     Saturday morning 2. LOCATION: Where does it hurt? (upper, mid or lower back)     Lower back right side 3. SEVERITY: How bad is the pain?  (e.g., Scale 1-10; mild, moderate, or severe)     severe 4. PATTERN: Is the pain constant? (e.g., yes, no; constant, intermittent)      constant 5. RADIATION: Does the pain shoot into your legs or somewhere else?     Shooting down right side into leg 6. CAUSE:  What do you think is causing the back pain?      Pinched nerve 7. BACK OVERUSE:  Any recent lifting of heavy objects, strenuous work or exercise?     no 8. MEDICINES: What have you taken so far for the pain? (e.g., nothing, acetaminophen , NSAIDS)     Advil  and tylenol  9. NEUROLOGIC SYMPTOMS: Do you have any weakness, numbness, or problems with bowel/bladder control?     Weakness in the right leg and some swelling in right foot 10. OTHER SYMPTOMS: Do you have any other symptoms? (e.g., fever, abdomen pain, burning with urination, Blankenbaker in urine)       no  Protocols used:  Back Pain-A-AH

## 2023-09-29 NOTE — Progress Notes (Unsigned)
 Cardiology Office Note:  .   Date:  09/30/2023  ID:  Kayla Zimmerman, DOB 1975/09/04, MRN [id number] PCP: Kayla Zimmerman LABOR, NP  [facility] Providers Cardiologist:  Kayla ONEIDA Decent, MD    History of Present Illness: .   Kayla Zimmerman is a 48 y.o. female   with a self reported history of mitral valve prolapse (not seen on Echo in 04/2022), migraines, chronic back pain, and depression, history of syncope in the setting of influenza and poor oral intake felt to be vasal vagal.   Patient was referred to Dr. Decent in 02/2022 for further evaluation of mitral valve prolapse which she reported being diagnosed with in her 48s. She also reported an isolated syncopal episode the month prior in the setting of influenza and poor PO intake. She was also on Phentermine  at the the time for weight loss. Episode sounded vasovagal in nature and no additional work-up was felt to be necessary. Echo was ordered for further evaluation of mitral valve prolapse and showed LVEF of 65-70% with normal wall motion and diastolic function, normal RV function, and no significant valvular disease. There was no evidence of mitral valve prolapse.   Patient here today with her son. Denies chest pain, dyspnea, palpitations, edema. Walking 3 miles/day. [facility] They want to start her on phentermine  because she's nauseated on zepbound . She had tachycardia on it in the past and that is when she passed out while having the flu. She would like to try it again.   ROS:   Studies Reviewed: Kayla Zimmerman    EKG Interpretation Date/Time:  Tuesday September 30 2023 08:40:29 EDT Ventricular Rate:  71 PR Interval:  148 QRS Duration:  82 QT Interval:  372 QTC Calculation: 404 R Axis:   26  Text Interpretation: Normal sinus rhythm Normal ECG No previous ECGs available Confirmed by Kayla Zimmerman [phone]) on 09/30/2023 8:42:58 AM    Prior CV Studies:    Echocardiogram 04/12/2022: Impressions: 1. Left  ventricular ejection fraction, by estimation, is 65 to 70%. The  left ventricle has normal function. The left ventricle has no regional  wall motion abnormalities. Left ventricular diastolic parameters were  normal.   2. Right ventricular systolic function is normal. The right ventricular  size is normal. Tricuspid regurgitation signal is inadequate for assessing  PA pressure.   3. The mitral valve is normal in structure. Trivial mitral valve  regurgitation. No evidence of mitral stenosis.   4. The aortic valve was not well visualized. Aortic valve regurgitation  is not visualized. No aortic stenosis is present.   5. The inferior vena cava is normal in size with greater than 50%  respiratory variability, suggesting right atrial pressure of 3 mmHg.    Risk Assessment/Calculations:             Physical Exam:   VS:  BP 128/74   Pulse 81   Ht 5' 3 (1.6 m)   Wt 195 lb 6.4 oz (88.6 kg)   LMP 09/02/2023 (Exact Date)   SpO2 96%   BMI 34.61 kg/m    Orhtostatics: No data found. Wt Readings from Last 3 Encounters:  09/30/23 195 lb 6.4 oz (88.6 kg)  09/29/23 194 lb 3.2 oz (88.1 kg)  09/23/23 190 lb (86.2 kg)    GEN: Well nourished, well developed in no acute distress NECK: No JVD; No carotid bruits CARDIAC:  RRR, no murmurs, rubs, gallops RESPIRATORY:  Clear to auscultation without  rales, wheezing or rhonchi  ABDOMEN: Soft, non-tender, non-distended EXTREMITIES:  No edema; No deformity   ASSESSMENT AND PLAN: .    History of MVP but no evidence on echo 04/2022. Echo completely normal, EKG norma. She'd like to try phentermine  for weight loss. She had tachycardia and vasovagal syncope on it in 2023 in the setting of influenza. She has a normal heart so it's reasonable to try phentermine  again but be aware of the cardiac side effects.   Obesity-she's [facility] using [facility]epbound  but is nauseated on it and wants to start phentermine . See  above.  Hyperlipidemia-LDL 130 07/2023, working on diet, exercise and weight loss.        Dispo: f/u in 1 yr  Signed, Kayla Pavy, PA-C

## 2023-09-29 NOTE — Telephone Encounter (Signed)
 Noted. Patient scheduled for an appointment for tomorrow with Lauren.

## 2023-09-30 ENCOUNTER — Encounter: Payer: Self-pay | Admitting: Nurse Practitioner

## 2023-09-30 ENCOUNTER — Ambulatory Visit: Attending: Physician Assistant | Admitting: Physician Assistant

## 2023-09-30 VITALS — BP 128/74 | HR 81 | Ht 63.0 in | Wt 195.4 lb

## 2023-09-30 DIAGNOSIS — E7849 Other hyperlipidemia: Secondary | ICD-10-CM | POA: Diagnosis not present

## 2023-09-30 DIAGNOSIS — E66811 Obesity, class 1: Secondary | ICD-10-CM

## 2023-09-30 DIAGNOSIS — M549 Dorsalgia, unspecified: Secondary | ICD-10-CM

## 2023-09-30 DIAGNOSIS — I341 Nonrheumatic mitral (valve) prolapse: Secondary | ICD-10-CM

## 2023-09-30 NOTE — Assessment & Plan Note (Signed)
 Chronic low back pain is exacerbated, with pain radiating to the glutes, hamstrings, and ankle. There is no incontinence or fever. Severe pain is unrelieved by OTC medications. Prednisone  side effects were discussed, and she agreed to try it, aware of the potential for a rash. Prescribe Flexeril  10mg  TID prn muscle relaxation. Prescribe prednisone , 40mg  daily for 5 days, taken in the morning with food. Monitor for rash and discontinue if it occurs. Administer a Toradol  30mg  IM injection for immediate pain relief. Advise the use of heat or ice for comfort. Encourage movement to prevent stiffness and avoid complete rest. Follow-up if symptoms worsen or don't improve.

## 2023-09-30 NOTE — Patient Instructions (Signed)
 Medication Instructions:  Your physician recommends that you continue on your current medications as directed. Please refer to the Current Medication list given to you today.  *If you need a refill on your cardiac medications before your next appointment, please call your pharmacy*  Lab Work: If you have labs (Dysert work) drawn today and your tests are completely normal, you will receive your results only by: MyChart Message (if you have MyChart) OR A paper copy in the mail If you have any lab test that is abnormal or we need to change your treatment, we will call you to review the results.  Testing/Procedures: None ordered today.  Follow-Up: At [facility], you and your health needs are our priority.  As part of our continuing mission to provide you with exceptional heart care, our providers are all part of one team.  This team includes your primary Cardiologist (physician) and Advanced Practice Providers or APPs (Physician Assistants and Nurse Practitioners) who all work together to provide you with the care you need, when you need it.  Your next appointment:   1 year(s)  Provider:   Dr. Barbaraann    We recommend signing up for the patient portal called MyChart.  Sign up information is provided on this After Visit Summary.  MyChart is used to connect with patients for Virtual Visits (Telemedicine).  Patients are able to view lab/test results, encounter notes, upcoming appointments, etc.  Non-urgent messages can be sent to your provider as well.   To learn more about what you can do with MyChart, go to [email or web address].

## 2023-10-01 DIAGNOSIS — M9902 Segmental and somatic dysfunction of thoracic region: Secondary | ICD-10-CM | POA: Diagnosis not present

## 2023-10-01 DIAGNOSIS — M9903 Segmental and somatic dysfunction of lumbar region: Secondary | ICD-10-CM | POA: Diagnosis not present

## 2023-10-01 DIAGNOSIS — M9901 Segmental and somatic dysfunction of cervical region: Secondary | ICD-10-CM | POA: Diagnosis not present

## 2023-10-01 DIAGNOSIS — M9904 Segmental and somatic dysfunction of sacral region: Secondary | ICD-10-CM | POA: Diagnosis not present

## 2023-10-01 MED ORDER — TRAMADOL HCL 50 MG PO TABS: 50.0000 mg | ORAL_TABLET | Freq: Two times a day (BID) | ORAL | 0 refills | Status: AC | PRN

## 2023-10-02 ENCOUNTER — Other Ambulatory Visit: Payer: Self-pay | Admitting: Nurse Practitioner

## 2023-10-02 DIAGNOSIS — M9901 Segmental and somatic dysfunction of cervical region: Secondary | ICD-10-CM | POA: Diagnosis not present

## 2023-10-02 DIAGNOSIS — M9904 Segmental and somatic dysfunction of sacral region: Secondary | ICD-10-CM | POA: Diagnosis not present

## 2023-10-02 DIAGNOSIS — M9903 Segmental and somatic dysfunction of lumbar region: Secondary | ICD-10-CM | POA: Diagnosis not present

## 2023-10-02 DIAGNOSIS — M9902 Segmental and somatic dysfunction of thoracic region: Secondary | ICD-10-CM | POA: Diagnosis not present

## 2023-10-02 DIAGNOSIS — E66811 Obesity, class 1: Secondary | ICD-10-CM

## 2023-10-02 MED ORDER — LOMAIRA 8 MG PO TABS: ORAL_TABLET | ORAL | 0 refills | Status: DC

## 2023-10-02 NOTE — Telephone Encounter (Signed)
 Patient called regarding this message. She wanted to make sure Corean is aware.

## 2023-10-03 DIAGNOSIS — M9903 Segmental and somatic dysfunction of lumbar region: Secondary | ICD-10-CM | POA: Diagnosis not present

## 2023-10-03 DIAGNOSIS — M9901 Segmental and somatic dysfunction of cervical region: Secondary | ICD-10-CM | POA: Diagnosis not present

## 2023-10-03 DIAGNOSIS — M9902 Segmental and somatic dysfunction of thoracic region: Secondary | ICD-10-CM | POA: Diagnosis not present

## 2023-10-03 DIAGNOSIS — M9904 Segmental and somatic dysfunction of sacral region: Secondary | ICD-10-CM | POA: Diagnosis not present

## 2023-10-07 DIAGNOSIS — M9904 Segmental and somatic dysfunction of sacral region: Secondary | ICD-10-CM | POA: Diagnosis not present

## 2023-10-07 DIAGNOSIS — M9902 Segmental and somatic dysfunction of thoracic region: Secondary | ICD-10-CM | POA: Diagnosis not present

## 2023-10-07 DIAGNOSIS — M9901 Segmental and somatic dysfunction of cervical region: Secondary | ICD-10-CM | POA: Diagnosis not present

## 2023-10-07 DIAGNOSIS — M9903 Segmental and somatic dysfunction of lumbar region: Secondary | ICD-10-CM | POA: Diagnosis not present

## 2023-10-14 NOTE — Telephone Encounter (Signed)
 Requesting: Tramadol  and Cyclobenzaprine .  Last Visit: 09/29/2023 Next Visit: 12/02/2023 Last Refill: 09/29/23, 10/01/23  Please Advise

## 2023-10-14 NOTE — Addendum Note (Signed)
 Addended by: GLADIS CLAUDENE GRATE Y on: 10/14/2023 01:05 PM   Modules accepted: Orders

## 2023-10-15 MED ORDER — CYCLOBENZAPRINE HCL 10 MG PO TABS: 10.0000 mg | ORAL_TABLET | Freq: Three times a day (TID) | ORAL | 0 refills | Status: DC | PRN

## 2023-10-15 NOTE — Telephone Encounter (Signed)
 Copied from CRM #[id number]. Topic: Clinical - Medication Question >> Oct 15, 2023  9:00 AM Henretta I wrote: Reason for CRM: Patient husband called and would like to know if the cyclobenzaprine  (FLEXERIL ) 10 MG tablet and traMADol  (ULTRAM ) 50 MG tablet, can be re prescribed to her as she ran out and is currently in severe pain. NT was offered put patients husband stated he did not need to speak with them. Phone number for callback [phone]   [id number] - [place], [place] - [address] AT [address] [facility] [address] [place] [address] Phone: [phone] Fax: [phone] Hours: Not open 24 hours

## 2023-10-15 NOTE — Telephone Encounter (Signed)
 Message sent back to patient via mychart of refill request approved.

## 2023-10-16 ENCOUNTER — Ambulatory Visit: Payer: Self-pay | Admitting: Nurse Practitioner

## 2023-10-16 DIAGNOSIS — M9902 Segmental and somatic dysfunction of thoracic region: Secondary | ICD-10-CM | POA: Diagnosis not present

## 2023-10-16 DIAGNOSIS — M9903 Segmental and somatic dysfunction of lumbar region: Secondary | ICD-10-CM | POA: Diagnosis not present

## 2023-10-16 DIAGNOSIS — M9901 Segmental and somatic dysfunction of cervical region: Secondary | ICD-10-CM | POA: Diagnosis not present

## 2023-10-16 DIAGNOSIS — M9904 Segmental and somatic dysfunction of sacral region: Secondary | ICD-10-CM | POA: Diagnosis not present

## 2023-10-16 NOTE — Telephone Encounter (Signed)
 FYI Only or Action Required?: Action required by provider: medication refill request.  Patient was last seen in primary care on 09/29/2023 by Nedra Tinnie LABOR, NP.  Called Nurse Triage reporting Back Pain.  Symptoms began several weeks ago.  Interventions attempted: Prescription medications: Shot of Tramadol  (did nothing per pt), cyclobenzaprine  (ran out on Sun), Tramadol  pills (ran out on Sun).  Symptoms are: unchanged.  Triage Disposition: See HCP Within 4 Hours (Or PCP Triage)  Patient/caregiver understands and will follow disposition?: No, wishes to speak with PCP                  Copied from CRM #[id number]. Topic: Clinical - Red Word Triage >> Oct 16, 2023  1:31 PM Martinique E wrote: [facility] that prompted transfer to Nurse Triage: Severe lower back pain that is shooting up and down leg.   *Patient's spouse, Alm, called in stating that he is currently out of town, but would like a nurse to reach out to the patient at [phone] to further discuss. Reason for Disposition  [1] SEVERE back pain (e.g., excruciating, unable to do any normal activities) AND [2] not improved 2 hours after pain medicine  Answer Assessment - Initial Assessment Questions Pt states she ran out of the tramadol  and cyclobenzaprine  on Sun. Pt states she got a refill of the cyclobenzaprine  yesterday and no refill of tramadol . Pt was referred to a specialist but no appt until next Thurs. Pt is requesting something for pain to to get her until next Thurs appt. Pt states she has also been to a chiropractor multiple times in the past few weeks.   [id number] - [place], [place] - [address] AT [facility] [address], [place] [address] Phone: [phone]  Fax: [phone]   ONSET: When did the pain begin? (e.g., minutes, hours, days)     The Sat before Labor Day weekend; pt saw PCP the Mon after  LOCATION: Where does it hurt? (upper, mid or  lower back)     Lower back; one specific spot on right side  SEVERITY: How bad is the pain?  (e.g., Scale 1-10; mild, moderate, or severe)     10/10 about 75% of the day  PATTERN: Is the pain constant? (e.g., yes, no; constant, intermittent)      Constant; once or twice it has gone away completely but pt is either lying flat on an ice pack without moving or in child's pose  RADIATION: Does the pain shoot into your legs or somewhere else?     Shoots down right leg  CAUSE:  What do you think is causing the back pain?   NEUROLOGIC SYMPTOMS: Do you have any weakness, numbness, or problems with bowel/bladder control?     Numbness on right little toe and toe next to it intermittent started last week  Protocols used: Back Pain-A-AH

## 2023-10-17 ENCOUNTER — Ambulatory Visit: Admitting: Internal Medicine

## 2023-10-20 DIAGNOSIS — M9902 Segmental and somatic dysfunction of thoracic region: Secondary | ICD-10-CM | POA: Diagnosis not present

## 2023-10-20 DIAGNOSIS — M9904 Segmental and somatic dysfunction of sacral region: Secondary | ICD-10-CM | POA: Diagnosis not present

## 2023-10-20 DIAGNOSIS — M9903 Segmental and somatic dysfunction of lumbar region: Secondary | ICD-10-CM | POA: Diagnosis not present

## 2023-10-20 DIAGNOSIS — M9901 Segmental and somatic dysfunction of cervical region: Secondary | ICD-10-CM | POA: Diagnosis not present

## 2023-10-21 ENCOUNTER — Encounter: Payer: Self-pay | Admitting: Nurse Practitioner

## 2023-10-21 ENCOUNTER — Telehealth: Payer: Self-pay

## 2023-10-21 ENCOUNTER — Ambulatory Visit: Admitting: Nurse Practitioner

## 2023-10-21 VITALS — BP 127/79 | HR 83 | Temp 98.5°F | Ht 63.0 in | Wt 196.0 lb

## 2023-10-21 DIAGNOSIS — Z6834 Body mass index (BMI) 34.0-34.9, adult: Secondary | ICD-10-CM | POA: Diagnosis not present

## 2023-10-21 DIAGNOSIS — E66811 Other obesity due to excess calories: Secondary | ICD-10-CM

## 2023-10-21 DIAGNOSIS — E559 Vitamin D deficiency, unspecified: Secondary | ICD-10-CM

## 2023-10-21 MED ORDER — PHENTERMINE-TOPIRAMATE ER 7.5-46 MG PO CP24: ORAL_CAPSULE | ORAL | 0 refills | Status: DC

## 2023-10-21 NOTE — Telephone Encounter (Signed)
 PA submitted through Cover My Meds for Qsymia . Awaiting insurance determination. Key: [id number]

## 2023-10-21 NOTE — Progress Notes (Signed)
 Office: [phone]  /  Fax: [phone]  WEIGHT SUMMARY AND BIOMETRICS  Weight Lost Since Last Visit: 0lb  Weight Gained Since Last Visit: 6lb   Vitals Temp: 98.5 F (36.9 C) BP: 127/79 Pulse Rate: 83 SpO2: 96 %   Anthropometric Measurements Height: 5' 3 (1.6 m) Weight: 196 lb (88.9 kg) BMI (Calculated): 34.73 Weight at Last Visit: 190lb Weight Lost Since Last Visit: 0lb Weight Gained Since Last Visit: 6lb Starting Weight: 230lb Total Weight Loss (lbs): 34 lb (15.4 kg)   Body Composition  Body Fat %: 41.6 % Fat Mass (lbs): 81.6 lbs Muscle Mass (lbs): 108.6 lbs Total Body Water (lbs): 83.4 lbs Visceral Fat Rating : 10   Other Clinical Data Fasting: No Labs: No Today's Visit #: 25 Starting Date: 11/27/21     HPI  Chief Complaint: OBESITY  Kayla Zimmerman is here to discuss her progress with her obesity treatment plan. She is on the the Category 3 Plan and states she is following her eating plan approximately 50 % of the time. She states she is exercising 0 minutes 0 days per week.   Interval History:  Since last office visit she has gained 6 pounds.  She hurt her back since her last visit and hasn't been able to exercise. She saw her PCP last on 09/29/23 and has an appt with ortho on 10/23/23. She has been seeing a chiropractor 2-3 times per week and had x rays obtained at his office.  She was treated with flexeril , toradol  and prednisone .  She hasn't been able to exercise due to pain. She recently purchased a walking pad.    She has lost a total of 34 pounds starting the program 11/27/21    Pharmacotherapy for weight loss: She is currently taking Lomaira  8mg  for medical weight loss.  Denies side effects.  Denies chest pain, palpitations and SHOB.  She saw cardiology last on 09/30/2023 and discussed starting Phentermine  with cardiology.  BP looks good today at 127/79.  Heart rate is 83.  She stopped taking Zepbound  after her last visit due to side effects of nausea  and reflux.  Has subsided since stopping Zepbound .  Previous pharmacotherapy for medical weight loss:   -She stopped Phentermine  due to side effects of tachycardia (12/19/21)  -She stopped Zepbound  due to side effects of nausea, diarrhea and constipation.   -Unable to take Wellbutrin due to nausea and vomiting-Avoid Contrave   Bariatric surgery:  Patient has not had bariatric surgery  Vit D deficiency  She is taking Vit D 50,000 IU every 2 weeks.  Denies side effects.  Denies nausea, vomiting or muscle weakness.    Lab Results  Component Value Date   VD25OH 47.9 07/22/2023   VD25OH 46.6 01/27/2023   VD25OH 29.5 (L) 06/26/2022     PHYSICAL EXAM:  Tiegs pressure 127/79, pulse 83, temperature 98.5 F (36.9 C), height 5' 3 (1.6 m), weight 196 lb (88.9 kg), last menstrual period 09/23/2023, SpO2 96%. Body mass index is 34.72 kg/m.  General: She is overweight, cooperative, alert, well developed, and in no acute distress. PSYCH: Has normal mood, affect and thought process.   Extremities: No edema.  Neurologic: No gross sensory or motor deficits. No tremors or fasciculations noted.    DIAGNOSTIC DATA REVIEWED:  BMET    Component Value Date/Time   NA 140 07/22/2023 0939   K 4.5 07/22/2023 0939   CL 103 07/22/2023 0939   CO2 19 (L) 07/22/2023 0939   GLUCOSE 96 07/22/2023 0939  GLUCOSE 102 (H) 04/12/2013 0937   BUN 9 07/22/2023 0939   CREATININE 0.71 07/22/2023 0939   CREATININE 0.60 04/12/2013 0937   CALCIUM 9.7 07/22/2023 0939   GFRNONAA >89 04/12/2013 0937   GFRAA >89 04/12/2013 0937   Lab Results  Component Value Date   HGBA1C 5.6 01/27/2023   HGBA1C 6.0 (H) 11/27/2021   Lab Results  Component Value Date   INSULIN  23.6 01/27/2023   INSULIN  23.7 11/27/2021   Lab Results  Component Value Date   TSH 2.690 01/27/2023   CBC    Component Value Date/Time   WBC 9.8 01/27/2023 0934   WBC 7.7 04/12/2013 0937   RBC 4.96 01/27/2023 0934   RBC 4.68 04/12/2013 0937    HGB 13.6 01/27/2023 0934   HCT 42.5 01/27/2023 0934   PLT 432 01/27/2023 0934   MCV 86 01/27/2023 0934   MCH 27.4 01/27/2023 0934   MCH 29.7 04/12/2013 0937   MCHC 32.0 01/27/2023 0934   MCHC 34.2 04/12/2013 0937   RDW 14.0 01/27/2023 0934   Iron Studies No results found for: IRON, TIBC, FERRITIN, IRONPCTSAT Lipid Panel     Component Value Date/Time   CHOL 201 (H) 07/22/2023 0939   TRIG 101 07/22/2023 0939   HDL 52 07/22/2023 0939   CHOLHDL 3.7 04/12/2013 0937   VLDL 25 04/12/2013 0937   LDLCALC 131 (H) 07/22/2023 0939   Hepatic Function Panel     Component Value Date/Time   PROT 6.9 07/22/2023 0939   ALBUMIN 4.5 07/22/2023 0939   AST 16 07/22/2023 0939   ALT 14 07/22/2023 0939   ALKPHOS 102 07/22/2023 0939   BILITOT 0.2 07/22/2023 0939      Component Value Date/Time   TSH 2.690 01/27/2023 0934   Nutritional Lab Results  Component Value Date   VD25OH 47.9 07/22/2023   VD25OH 46.6 01/27/2023   VD25OH 29.5 (L) 06/26/2022     ASSESSMENT AND PLAN  TREATMENT PLAN FOR OBESITY:  Recommended Dietary Goals  Elyn is currently in the action stage of change. As such, her goal is to continue weight management plan. She has agreed to practicing portion control and making smarter food choices, such as increasing vegetables and decreasing simple carbohydrates.  Behavioral Intervention  We discussed the following Behavioral Modification Strategies today: increasing lean protein intake to established goals, decreasing simple carbohydrates , increasing vegetables, increasing fiber rich foods, increasing water intake , work on meal planning and preparation, reading food labels , keeping healthy foods at home, continue to work on maintaining a reduced calorie state, getting the recommended amount of protein, incorporating whole foods, making healthy choices, staying well hydrated and practicing mindfulness when eating., and increase protein intake, fibrous foods (25  grams per day for women, 30 grams for men) and water to improve satiety and decrease hunger signals. .  Additional resources provided today: NA  Recommended Physical Activity Goals  Metztli has been advised to work up to 150 minutes of moderate intensity aerobic activity a week and strengthening exercises 2-3 times per week for cardiovascular health, weight loss maintenance and preservation of muscle mass.   She has agreed to exercise based upon ortho's recommendations.     Pharmacotherapy We discussed various medication options to help Taqwa with her weight loss efforts and we both agreed to stop Lomaria and start Qsymia .  Side effects discussed.  Her husband has had a vasectomy.  Has seen cardiology and was cleared to take Qsymia .    -Avoid Contrave due to side  effects to Wellbutrin -Avoid orlistat due to a Vit D def  ASSOCIATED CONDITIONS ADDRESSED TODAY  Action/Plan  Vitamin D  deficiency Doing well.  Continue Vit D 50,000 international units every 2 weeks.    Class 1 obesity due to excess calories with serious comorbidity and body mass index (BMI) of 34.0 to 34.9 in adult -     Start Phentermine -Topiramate  ER; Take one po daily  Dispense: 30 capsule; Refill: 0. Side effects discussed.       Will obtain labs in December     Return in about 4 weeks (around 11/18/2023).[name] She was informed of the importance of frequent follow up visits to maximize her success with intensive lifestyle modifications for her multiple health conditions.   ATTESTASTION STATEMENTS:  Reviewed by clinician on day of visit: allergies, medications, problem list, medical history, surgical history, family history, social history, and previous encounter notes.     Corean SAUNDERS. Vidit Boissonneault FNP-C

## 2023-10-21 NOTE — Patient Instructions (Signed)
 What is Qsymia and how does it work?  Qsymia is a prescription only medicine to help with your weight loss. It is a combination of two medicines that are low dose, long-acting: Phentermine & Topiramate. Qsymia contains low dose Phentermine which is a stimulant medicine that could affect your heart rate and blood pressure Qsymia is designed to help you feel satisfied faster that will help you to decrease portion size. Also, it helps curb late night snacking habits. Some food you usually enjoy may start to taste differently which will help you make healthier food choices.  This medicine will be most effective when combined with a reduced calorie diet and physical activity.  How should I take Qsymia? Take daily in the morning with breakfast. Swallow the extended-release capsule whole. Do not crush, break, or chew it.  If you miss a dose, take it as soon as possible. If it is after 12pm, skip the missed dose and go back to your regular dosing schedule. Do not take extra medicine to make up for the missed dose. You have received two separate prescriptions today. You will initially take a lower dose of 3.75mg /23mg  for 14 days then increase to a higher dose of 7.5mg /46mg  for maintenance for 30 days. There are 4 total dosing options. Your provider will discuss any need to go up to a higher dose during your office visits.  If you are taking Levothyroxine, take the Levothyroxine 1 hours before breakfast and the take the Qsymia 1 hour after breakfast. Do not stop taking Qsymia without talking to your provider. Stopping Qsymia suddenly can cause serious side effects, such as seizures and headaches.   What should I avoid while taking Qsymia? Limit caffeine to 1 small cup daily. Examples are soda, coffee, tea, herbal tea, energy drinks, and chocolate Avoid decongestant medicines like Sudafed, Mucinex-D, and Zyrtec-D. Qsymia may cause you to feel dizzy, drowsy, or confused, or to have trouble thinking  or speaking. Do not drive or do anything else that could be dangerous until you know how this medicine affects you.  Women who can become pregnant: Use effective birth control (contraception) consistently while taking Qsymia. If you miss a menstrual period, STOP Qsymia and call our office immediately. Pregnancy tests will be performed at your appointment if indicated.  What side effects may I notice when taking Qsymia? Side effects that usually do not require medical attention (report to our office if they continue or are bothersome): Dry mouth (drink at least 64 oz of fluid daily) Constipation (you may take over the counter laxative if needed) Metallic taste in your mouth when drinking carbonated beverages Numbness or tingling in the hands, arms, feet or face that lasts more than a week Headache Sudden changes in vision Mental fuzziness (problems with concentration, attention, memory or speech) Trouble sleeping (insomnia) Side effects that you should report to our office as soon as possible: Increases in heart rate and/or palpitations (feeling like your heart is racing or pounding in your chest that lasts several minutes) Chest pain Increased blood pressure Dizziness or feeling faint Shortness of breath Irritability Feeling anxious, agitated, restless, or nervous Depression or severe changes in mood Problems urinating Unusual swelling of the legs Vomiting   Other important information You will be asked to sign an informed consent prior to starting Qysmia Qsymia is a federally controlled substance. Keep Qsymia in a safe place to prevent misuse and abuse. Selling or giving away Qsymia may harm others, and is against the law.  Your prescription  will be sent to the pharmacy during your visit.  Refills will require an office visit. Your insurance may not cover the cost of this medicine. Our office will complete a pre-authorization if required by your insurance.

## 2023-10-21 NOTE — Telephone Encounter (Signed)
 Per Cover [employer] Qsymia  has been approved.  CaseId:[id number];Status:Approved;Review Type:Prior Auth;Coverage Start Date:09/21/2023;Coverage End Date:04/18/2024;

## 2023-10-23 ENCOUNTER — Encounter: Payer: Self-pay | Admitting: Physical Medicine and Rehabilitation

## 2023-10-23 ENCOUNTER — Ambulatory Visit: Admitting: Physical Medicine and Rehabilitation

## 2023-10-23 ENCOUNTER — Other Ambulatory Visit (INDEPENDENT_AMBULATORY_CARE_PROVIDER_SITE_OTHER): Payer: Self-pay

## 2023-10-23 DIAGNOSIS — G8929 Other chronic pain: Secondary | ICD-10-CM

## 2023-10-23 DIAGNOSIS — M4316 Spondylolisthesis, lumbar region: Secondary | ICD-10-CM

## 2023-10-23 DIAGNOSIS — M5441 Lumbago with sciatica, right side: Secondary | ICD-10-CM

## 2023-10-23 DIAGNOSIS — M5416 Radiculopathy, lumbar region: Secondary | ICD-10-CM
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
## 2023-10-23 NOTE — Progress Notes (Signed)
 Pain Scale   Average Pain 5 Patient advising she has chronic lower back pain radiating to right hip leg and causing numbness/tingling to foot and little toe.        +Driver, -BT, -Dye Allergies.

## 2023-10-28 ENCOUNTER — Encounter: Payer: Self-pay | Admitting: Physical Medicine and Rehabilitation

## 2023-10-28 DIAGNOSIS — M9904 Segmental and somatic dysfunction of sacral region: Secondary | ICD-10-CM | POA: Diagnosis not present

## 2023-10-28 DIAGNOSIS — M9902 Segmental and somatic dysfunction of thoracic region: Secondary | ICD-10-CM | POA: Diagnosis not present

## 2023-10-28 DIAGNOSIS — M9903 Segmental and somatic dysfunction of lumbar region: Secondary | ICD-10-CM | POA: Diagnosis not present

## 2023-10-28 DIAGNOSIS — M9901 Segmental and somatic dysfunction of cervical region: Secondary | ICD-10-CM | POA: Diagnosis not present

## 2023-10-30 DIAGNOSIS — M9901 Segmental and somatic dysfunction of cervical region: Secondary | ICD-10-CM | POA: Diagnosis not present

## 2023-10-30 DIAGNOSIS — M9902 Segmental and somatic dysfunction of thoracic region: Secondary | ICD-10-CM | POA: Diagnosis not present

## 2023-10-30 DIAGNOSIS — M9904 Segmental and somatic dysfunction of sacral region: Secondary | ICD-10-CM | POA: Diagnosis not present

## 2023-10-30 DIAGNOSIS — M9903 Segmental and somatic dysfunction of lumbar region: Secondary | ICD-10-CM | POA: Diagnosis not present

## 2023-11-04 DIAGNOSIS — M9902 Segmental and somatic dysfunction of thoracic region: Secondary | ICD-10-CM | POA: Diagnosis not present

## 2023-11-04 DIAGNOSIS — M9901 Segmental and somatic dysfunction of cervical region: Secondary | ICD-10-CM | POA: Diagnosis not present

## 2023-11-04 DIAGNOSIS — M9904 Segmental and somatic dysfunction of sacral region: Secondary | ICD-10-CM | POA: Diagnosis not present

## 2023-11-04 DIAGNOSIS — M9903 Segmental and somatic dysfunction of lumbar region: Secondary | ICD-10-CM | POA: Diagnosis not present

## 2023-11-06 ENCOUNTER — Ambulatory Visit
Admission: RE | Admit: 2023-11-06 | Discharge: 2023-11-06 | Disposition: A | Source: Ambulatory Visit | Attending: Physical Medicine and Rehabilitation | Admitting: Physical Medicine and Rehabilitation

## 2023-11-06 DIAGNOSIS — G8929 Other chronic pain: Secondary | ICD-10-CM

## 2023-11-06 DIAGNOSIS — M4316 Spondylolisthesis, lumbar region: Secondary | ICD-10-CM

## 2023-11-06 DIAGNOSIS — M5416 Radiculopathy, lumbar region: Secondary | ICD-10-CM

## 2023-11-07 ENCOUNTER — Telehealth: Payer: Self-pay

## 2023-11-07 NOTE — Telephone Encounter (Signed)
 OV for lumbar MRI review. LMX1 to schedule OV

## 2023-11-13 DIAGNOSIS — M9902 Segmental and somatic dysfunction of thoracic region: Secondary | ICD-10-CM | POA: Diagnosis not present

## 2023-11-13 DIAGNOSIS — M9904 Segmental and somatic dysfunction of sacral region: Secondary | ICD-10-CM | POA: Diagnosis not present

## 2023-11-13 DIAGNOSIS — M9903 Segmental and somatic dysfunction of lumbar region: Secondary | ICD-10-CM | POA: Diagnosis not present

## 2023-11-13 DIAGNOSIS — M9901 Segmental and somatic dysfunction of cervical region: Secondary | ICD-10-CM | POA: Diagnosis not present

## 2023-11-20 DIAGNOSIS — M9901 Segmental and somatic dysfunction of cervical region: Secondary | ICD-10-CM | POA: Diagnosis not present

## 2023-11-20 DIAGNOSIS — M9902 Segmental and somatic dysfunction of thoracic region: Secondary | ICD-10-CM | POA: Diagnosis not present

## 2023-11-20 DIAGNOSIS — M9903 Segmental and somatic dysfunction of lumbar region: Secondary | ICD-10-CM | POA: Diagnosis not present

## 2023-11-20 DIAGNOSIS — M9904 Segmental and somatic dysfunction of sacral region: Secondary | ICD-10-CM | POA: Diagnosis not present

## 2023-11-25 ENCOUNTER — Other Ambulatory Visit: Payer: Self-pay | Admitting: Nurse Practitioner

## 2023-11-25 ENCOUNTER — Encounter: Payer: Self-pay | Admitting: Nurse Practitioner

## 2023-11-25 ENCOUNTER — Ambulatory Visit: Admitting: Nurse Practitioner

## 2023-11-25 VITALS — BP 139/77 | HR 101 | Temp 98.2°F | Ht 63.0 in | Wt 203.0 lb

## 2023-11-25 DIAGNOSIS — Z6834 Body mass index (BMI) 34.0-34.9, adult: Secondary | ICD-10-CM | POA: Diagnosis not present

## 2023-11-25 DIAGNOSIS — M9902 Segmental and somatic dysfunction of thoracic region: Secondary | ICD-10-CM | POA: Diagnosis not present

## 2023-11-25 DIAGNOSIS — G8929 Other chronic pain: Secondary | ICD-10-CM | POA: Diagnosis not present

## 2023-11-25 DIAGNOSIS — M549 Dorsalgia, unspecified: Secondary | ICD-10-CM

## 2023-11-25 DIAGNOSIS — M9904 Segmental and somatic dysfunction of sacral region: Secondary | ICD-10-CM | POA: Diagnosis not present

## 2023-11-25 DIAGNOSIS — M9901 Segmental and somatic dysfunction of cervical region: Secondary | ICD-10-CM | POA: Diagnosis not present

## 2023-11-25 DIAGNOSIS — E66811 Obesity, class 1: Secondary | ICD-10-CM

## 2023-11-25 DIAGNOSIS — M9903 Segmental and somatic dysfunction of lumbar region: Secondary | ICD-10-CM | POA: Diagnosis not present

## 2023-11-25 MED ORDER — PHENTERMINE-TOPIRAMATE ER 7.5-46 MG PO CP24: ORAL_CAPSULE | ORAL | 0 refills | Status: DC

## 2023-11-25 NOTE — Progress Notes (Signed)
 Office: [phone]  /  Fax: [phone]  WEIGHT SUMMARY AND BIOMETRICS  Weight Lost Since Last Visit: 7lb  Weight Gained Since Last Visit: 0lb   Vitals Temp: 98.2 F (36.8 C) BP: 139/77 Pulse Rate: (!) 101 SpO2: 98 %   Anthropometric Measurements Height: 5' 3 (1.6 m) Weight: 203 lb (92.1 kg) BMI (Calculated): 35.97 Weight at Last Visit: 196lb Weight Lost Since Last Visit: 7lb Weight Gained Since Last Visit: 0lb Starting Weight: 230lb Total Weight Loss (lbs): 27 lb (12.2 kg)   Body Composition  Body Fat %: 42.4 % Fat Mass (lbs): 86 lbs Muscle Mass (lbs): 111 lbs Total Body Water (lbs): 85.4 lbs Visceral Fat Rating : 11   Other Clinical Data Fasting: no Labs: no Today's Visit #: 26 Starting Date: 11/27/21     HPI  Chief Complaint: OBESITY  Kayla Zimmerman is here to discuss her progress with her obesity treatment plan. She is on the the Category 3 Plan and states she is following her eating plan approximately 75-80 % of the time. She states she is walking 2 miles a day and 7 days per week.   Interval History:  Since last office visit she has gained 7 pounds.  She gained 6 pounds at her last visit. She reports she is following cat 3. She is measuring her foods, not weighing it. She is drinking water daily.  Denies sugary drinks. She continues to struggle with back pain and is limited on exercising. She feels that not being able to exercise is contributing to her weight gain.  She has seen ortho and had an MRI on 11/06/23.  She is walking 1 mile daily on the treadmill.    Pharmacotherapy for weight loss: She is currently taking Qsymia  dose 2 for medical weight loss.  Denies side effects.  Denies chest pain, palpitations and SHOB.    Qsymia  approved through 04/18/24   Previous pharmacotherapy for medical weight loss:   -She stopped Phentermine  due to side effects of tachycardia (12/19/21)  -She stopped Zepbound  due to side effects of nausea, diarrhea and  constipation.   -Unable to take Wellbutrin due to nausea and vomiting-Avoid Contrave -Lomaira -stopped after last visit and started her on Qsymia   -Avoid Contrave due to side effects to Wellbutrin -Avoid orlistat due to a Vit D def   Bariatric surgery:  Patient has not had bariatric surgery  PHYSICAL EXAM:  Bulger pressure 139/77, pulse (!) 101, temperature 98.2 F (36.8 C), height 5' 3 (1.6 m), weight 203 lb (92.1 kg), SpO2 98%. Body mass index is 35.96 kg/m.  General: She is overweight, cooperative, alert, well developed, and in no acute distress. PSYCH: Has normal mood, affect and thought process.   Extremities: No edema.  Neurologic: No gross sensory or motor deficits. No tremors or fasciculations noted.    DIAGNOSTIC DATA REVIEWED:  BMET    Component Value Date/Time   NA 140 07/22/2023 0939   K 4.5 07/22/2023 0939   CL 103 07/22/2023 0939   CO2 19 (L) 07/22/2023 0939   GLUCOSE 96 07/22/2023 0939   GLUCOSE 102 (H) 04/12/2013 0937   BUN 9 07/22/2023 0939   CREATININE 0.71 07/22/2023 0939   CREATININE 0.60 04/12/2013 0937   CALCIUM 9.7 07/22/2023 0939   GFRNONAA >89 04/12/2013 0937   GFRAA >89 04/12/2013 0937   Lab Results  Component Value Date   HGBA1C 5.6 01/27/2023   HGBA1C 6.0 (H) 11/27/2021   Lab Results  Component Value Date   INSULIN  23.6 01/27/2023  INSULIN  23.7 11/27/2021   Lab Results  Component Value Date   TSH 2.690 01/27/2023   CBC    Component Value Date/Time   WBC 9.8 01/27/2023 0934   WBC 7.7 04/12/2013 0937   RBC 4.96 01/27/2023 0934   RBC 4.68 04/12/2013 0937   HGB 13.6 01/27/2023 0934   HCT 42.5 01/27/2023 0934   PLT 432 01/27/2023 0934   MCV 86 01/27/2023 0934   MCH 27.4 01/27/2023 0934   MCH 29.7 04/12/2013 0937   MCHC 32.0 01/27/2023 0934   MCHC 34.2 04/12/2013 0937   RDW 14.0 01/27/2023 0934   Iron Studies No results found for: IRON, TIBC, FERRITIN, IRONPCTSAT Lipid Panel     Component Value Date/Time   CHOL  201 (H) 07/22/2023 0939   TRIG 101 07/22/2023 0939   HDL 52 07/22/2023 0939   CHOLHDL 3.7 04/12/2013 0937   VLDL 25 04/12/2013 0937   LDLCALC 131 (H) 07/22/2023 0939   Hepatic Function Panel     Component Value Date/Time   PROT 6.9 07/22/2023 0939   ALBUMIN 4.5 07/22/2023 0939   AST 16 07/22/2023 0939   ALT 14 07/22/2023 0939   ALKPHOS 102 07/22/2023 0939   BILITOT 0.2 07/22/2023 0939      Component Value Date/Time   TSH 2.690 01/27/2023 0934   Nutritional Lab Results  Component Value Date   VD25OH 47.9 07/22/2023   VD25OH 46.6 01/27/2023   VD25OH 29.5 (L) 06/26/2022     ASSESSMENT AND PLAN  TREATMENT PLAN FOR OBESITY:  Recommended Dietary Goals  Kayla Zimmerman is currently in the action stage of change. As such, her goal is to continue weight management plan. She has agreed to track and will review calories and macros at next visit.    Will obtain IC at next visit.  Behavioral Intervention  We discussed the following Behavioral Modification Strategies today: increasing lean protein intake to established goals, decreasing simple carbohydrates , increasing vegetables, increasing fiber rich foods, increasing water intake , work on tracking and journaling calories using tracking application, reading food labels , keeping healthy foods at home, planning for success, better snacking choices, continue to work on maintaining a reduced calorie state, getting the recommended amount of protein, incorporating whole foods, making healthy choices, staying well hydrated and practicing mindfulness when eating., and increase protein intake, fibrous foods (25 grams per day for women, 30 grams for men) and water to improve satiety and decrease hunger signals. .  Additional resources provided today: NA  Recommended Physical Activity Goals  She is limited on exercising due to back pain and the patient is continuing to struggle with back pain. She is very frustrated that she cannot exercise as she  was in the past.     Pharmacotherapy We discussed various medication options to help Kayla Zimmerman with her weight loss efforts and we both agreed to continue Qsymia  until next visit.  Will consider stopping if she continues to gain weight.  -Avoid Contrave due to side effects to Wellbutrin -Avoid orlistat due to a Vit D def -she notes her insurance will not cover GLP-1s in 2026  ASSOCIATED CONDITIONS ADDRESSED TODAY  Action/Plan  Acute on chronic back pain I talked with her PCP on the phone today.  She will put in a referral to the neurosurgeon.  To keep appt with PCP on 12/02/23  Class 1 obesity due to excess calories with serious comorbidity and body mass index (BMI) of 34.0 to 34.9 in adult -     Continue  Phentermine -Topiramate  ER; Take one po daily  Dispense: 30 capsule; Refill: 0. Side effects discussed.      Will obtain IC at next visit.  Will obtain labs in December if not obtained next week during her CPE.    Return in about 4 weeks (around 12/23/2023).[name] She was informed of the importance of frequent follow up visits to maximize her success with intensive lifestyle modifications for her multiple health conditions.   I personally spent a total of 44 minutes in the care of the patient today including preparing to see the patient, getting/reviewing separately obtained history, performing a medically appropriate exam/evaluation, counseling and educating, referring and communicating with other health care professionals, documenting clinical information in the EHR, and coordinating care.   ATTESTASTION STATEMENTS:  Reviewed by clinician on day of visit: allergies, medications, problem list, medical history, surgical history, family history, social history, and previous encounter notes.      Corean SAUNDERS. Suheyb Raucci FNP-C

## 2023-11-27 DIAGNOSIS — M9903 Segmental and somatic dysfunction of lumbar region: Secondary | ICD-10-CM | POA: Diagnosis not present

## 2023-11-27 DIAGNOSIS — M9904 Segmental and somatic dysfunction of sacral region: Secondary | ICD-10-CM | POA: Diagnosis not present

## 2023-11-27 DIAGNOSIS — M9901 Segmental and somatic dysfunction of cervical region: Secondary | ICD-10-CM | POA: Diagnosis not present

## 2023-11-27 DIAGNOSIS — M9902 Segmental and somatic dysfunction of thoracic region: Secondary | ICD-10-CM | POA: Diagnosis not present

## 2023-12-02 ENCOUNTER — Ambulatory Visit (INDEPENDENT_AMBULATORY_CARE_PROVIDER_SITE_OTHER): Admitting: Nurse Practitioner

## 2023-12-02 ENCOUNTER — Encounter: Payer: Self-pay | Admitting: Nurse Practitioner

## 2023-12-02 VITALS — BP 124/74 | HR 82 | Temp 97.8°F | Ht 63.0 in | Wt 206.0 lb

## 2023-12-02 DIAGNOSIS — M9902 Segmental and somatic dysfunction of thoracic region: Secondary | ICD-10-CM | POA: Diagnosis not present

## 2023-12-02 DIAGNOSIS — M9904 Segmental and somatic dysfunction of sacral region: Secondary | ICD-10-CM | POA: Diagnosis not present

## 2023-12-02 DIAGNOSIS — G8929 Other chronic pain: Secondary | ICD-10-CM | POA: Insufficient documentation

## 2023-12-02 DIAGNOSIS — N939 Abnormal uterine and vaginal bleeding, unspecified: Secondary | ICD-10-CM | POA: Insufficient documentation

## 2023-12-02 DIAGNOSIS — E559 Vitamin D deficiency, unspecified: Secondary | ICD-10-CM | POA: Diagnosis not present

## 2023-12-02 DIAGNOSIS — G43829 Menstrual migraine, not intractable, without status migrainosus: Secondary | ICD-10-CM

## 2023-12-02 DIAGNOSIS — F321 Major depressive disorder, single episode, moderate: Secondary | ICD-10-CM

## 2023-12-02 DIAGNOSIS — M9901 Segmental and somatic dysfunction of cervical region: Secondary | ICD-10-CM | POA: Diagnosis not present

## 2023-12-02 DIAGNOSIS — R7303 Prediabetes: Secondary | ICD-10-CM

## 2023-12-02 DIAGNOSIS — M9903 Segmental and somatic dysfunction of lumbar region: Secondary | ICD-10-CM | POA: Diagnosis not present

## 2023-12-02 DIAGNOSIS — E782 Mixed hyperlipidemia: Secondary | ICD-10-CM | POA: Diagnosis not present

## 2023-12-02 DIAGNOSIS — M5441 Lumbago with sciatica, right side: Secondary | ICD-10-CM

## 2023-12-02 DIAGNOSIS — Z0000 Encounter for general adult medical examination without abnormal findings: Secondary | ICD-10-CM | POA: Diagnosis not present

## 2023-12-02 MED ORDER — GABAPENTIN 300 MG PO CAPS
300.0000 mg | ORAL_CAPSULE | Freq: Every day | ORAL | 1 refills | Status: AC
Start: 2023-12-02 — End: ?

## 2023-12-02 MED ORDER — ESCITALOPRAM OXALATE 10 MG PO TABS: 10.0000 mg | ORAL_TABLET | Freq: Every day | ORAL | 3 refills | Status: DC

## 2023-12-02 MED ORDER — NORELGESTROMIN-ETH ESTRADIOL 150-35 MCG/24HR TD PTWK: 1.0000 | MEDICATED_PATCH | TRANSDERMAL | 3 refills | Status: AC

## 2023-12-02 NOTE — Assessment & Plan Note (Signed)
 Chronic low back pain with right-sided radiculopathy persists despite improvement from chiropractic care. MRI revealed a bulging disc. Start gabapentin 300mg  at bedtime for pain relief and sleep aid, with the option to increase to twice daily if needed. Continue chiropractic care and exercises, including yoga stretches and strength training. Follow up with a neurosurgeon for further evaluation and management options, including potential spinal injections.

## 2023-12-02 NOTE — Patient Instructions (Signed)
 It was great to see you!  Start xulane patch once a week for 3 weeks for your periods and then leave off 1 week   Start gabapentin 1 capsule at bedtime for pain/sleep. If your pain worsens you can take it twice a day   Restart your lexapro  daily   Let's follow-up in 3 months, sooner if you have concerns.  If a referral was placed today, you will be contacted for an appointment. Please note that routine referrals can sometimes take up to 3-4 weeks to process. Please call our office if you haven't heard anything after this time frame.  Take care,  Tinnie Harada, NP

## 2023-12-02 NOTE — Assessment & Plan Note (Signed)
 Health maintenance reviewed and updated. Discussed nutrition, exercise. Follow-up 1 year.

## 2023-12-02 NOTE — Assessment & Plan Note (Signed)
 Continue vitamin D supplement and is scheduled for repeat labs in December.

## 2023-12-02 NOTE — Assessment & Plan Note (Signed)
 Irregular and heavy menstrual bleeding is due to perimenopausal symptoms. Start the Xulane patch once a week for three weeks, then off for one week, to regulate menstrual cycles. Monitor for side effects such as adhesive irritation or nausea.

## 2023-12-02 NOTE — Assessment & Plan Note (Signed)
 Chronic, stable. Continue focus on nutrition and exercise. Scheduled to have labs in December

## 2023-12-02 NOTE — Progress Notes (Signed)
 BP 124/74 (BP Location: Left Arm, Patient Position: Sitting, Cuff Size: Large)   Pulse 82   Temp 97.8 F (36.6 C)   Ht 5' 3 (1.6 m)   Wt 206 lb (93.4 kg)   LMP 11/29/2023 (Exact Date)   SpO2 98%   BMI 36.49 kg/m    Subjective:    Patient ID: Kayla Zimmerman, female    DOB: 10-06-1975, 48 y.o.   MRN: [id number]  CC: Chief Complaint  Patient presents with   Annual Exam    Concerns with back pain    HPI: Kayla Zimmerman is a 48 y.o. female presenting on 12/02/2023 for comprehensive medical examination. Current medical complaints include:back pain, irregular periods  Discussed the use of AI scribe software for clinical note transcription with the patient, who gave verbal consent to proceed.  Kayla Zimmerman experiences significant back pain that has persisted despite multiple consultations. An orthopedist dismissed her previous x-rays, leading to repeated imaging. Chiropractic care has provided some relief, but an MRI exacerbated her pain. The pain is severe and previously radiated down her right leg, causing numbness in her right foot. Chiropractic care has reduced the leg pain. She has improved her walking distance from a hundredth of a mile to a mile daily. She is not taking pain medication, as tramadol  was ineffective, and uses over-the-counter remedies like Goody's powders.  She is under significant stress due to personal issues, including her son leaving home unexpectedly, impacting her mental health. She plans to restart Lexapro  due to increased stress and depressive symptoms. She averages three hours of sleep per night, and melatonin worsens her depression.  She experiences perimenopausal symptoms, including irregular and heavy menstrual cycles, with periods ranging from three to eight weeks apart. She reports significant fatigue and cold intolerance during heavy periods.     Menopausal Symptoms: yes  Depression and Anxiety Screen done today and results listed below:     12/02/2023     9:37 AM 09/29/2023   11:29 AM 04/30/2023    1:07 PM 04/30/2023    1:06 PM 04/23/2023    2:37 PM  Depression screen PHQ 2/9  Decreased Interest 2 0 0 0 0  Down, Depressed, Hopeless 2 0  0 0  PHQ - 2 Score 4 0 0 0 0  Altered sleeping 3 2 0    Tired, decreased energy 3 1 0    Change in appetite 3 0 0    Feeling bad or failure about yourself  3 0 0    Trouble concentrating 0 0 0    Moving slowly or fidgety/restless 0 0 0    Suicidal thoughts 1 0 0    PHQ-9 Score 17 3 0    Difficult doing work/chores Somewhat difficult Somewhat difficult Not difficult at all        12/02/2023    9:37 AM 09/29/2023   11:29 AM 11/29/2022    8:47 AM 11/21/2021    1:39 PM  GAD 7 : Generalized Anxiety Score  Nervous, Anxious, on Edge 0 0 0 0  Control/stop worrying 3 0 0 0  Worry too much - different things 2 0 0 1  Trouble relaxing 1 0 0 1  Restless 0 0 0 0  Easily annoyed or irritable 0 0 1 1  Afraid - awful might happen 0 0 0 0  Total GAD 7 Score 6 0 1 3  Anxiety Difficulty Somewhat difficult Somewhat difficult Not difficult at all Somewhat difficult    The  patient does not have a history of falls. I did not complete a risk assessment for falls. A plan of care for falls was not documented.   Past Medical History:  Past Medical History:  Diagnosis Date   Arrhythmia 2002   Mitral valve prolapse   Back pain    Chronic back pain    Depression    Ganglion of joint    Migraines    MVP (mitral valve prolapse)     Surgical History:  Past Surgical History:  Procedure Laterality Date   WISDOM TOOTH EXTRACTION      Medications:  Current Outpatient Medications on File Prior to Visit  Medication Sig   B-D 3CC LUER-LOK SYR 25GX1 25G X 1 3 ML MISC    Cetirizine-Pseudoephedrine (ZYRTEC-D PO) Take by mouth as needed.   cyanocobalamin  (VITAMIN B12) 1000 MCG/ML injection Inject 1 mL (1,000 mcg total) into the muscle every 30 (thirty) days.   cyclobenzaprine  (FLEXERIL ) 10 MG tablet Take 1 tablet (10  mg total) by mouth 3 (three) times daily as needed for muscle spasms.   fluticasone  (FLONASE ) 50 MCG/ACT nasal spray Place 2 sprays into both nostrils daily.   Multiple Vitamin (MULTI-VITAMINS) TABS Take 1 tablet by mouth daily.   NEEDLE, DISP, 25 G (B-D DISP NEEDLE 25GX1) 25G X 1 MISC Take as directed with Vit B12   omeprazole  (PRILOSEC) 20 MG capsule Take 1 capsule (20 mg total) by mouth daily.   Phentermine -Topiramate  ER 7.5-46 MG CP24 Take one po daily   Vitamin D , Ergocalciferol , (DRISDOL ) 1.25 MG (50000 UNIT) CAPS capsule Take 1 capsule (50,000 Units total) by mouth every 7 (seven) days.   No current facility-administered medications on file prior to visit.    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  Tobacco Use   Smoking status: Never   Smokeless tobacco: Never  Vaping Use   Vaping status: Never Used  Substance and Sexual Activity   Alcohol use: Never   Drug use: Never   Sexual activity: Yes    Partners: Male    Birth control/protection: Surgical    Comment: husband vasectomy  Other Topics Concern   Not on file  Social History Narrative   Not on file   Social Drivers of Health   Financial Resource Strain: Not on file  Food Insecurity: Not on file  Transportation Needs: Not on file  Physical Activity: Not on file  Stress: Not on file  Social Connections: Not on file  Intimate Partner Violence: Not on file   Social History   Tobacco Use  Smoking Status Never  Smokeless Tobacco Never   Social History   Substance and Sexual Activity  Alcohol Use Never     Family History:  Family History  Problem Relation Age of Onset   Cancer Mother        leukemia   Mitral valve prolapse Mother    Arrhythmia Mother    Cancer Father        colon cancer   Breast cancer Maternal Aunt  Breast cancer Paternal Aunt    Mitral valve prolapse Maternal Grandfather    Arrhythmia Maternal Grandfather    Cancer Paternal Grandfather        skin, prostate   Diabetes Paternal Grandfather    Hypertension Paternal Grandfather     Past medical history, surgical history, medications, allergies, family history and social history reviewed with patient today and changes made to appropriate areas of the chart.   Review of Systems  Constitutional:  Positive for malaise/fatigue. Negative for fever.  HENT: Negative.    Eyes: Negative.   Respiratory: Negative.    Cardiovascular: Negative.   Gastrointestinal: Negative.   Genitourinary:  Negative for dysuria and urgency.       Irregular menstrual cycle  Musculoskeletal:  Positive for back pain.  Skin: Negative.   Neurological: Negative.   Psychiatric/Behavioral:  Positive for depression. The patient is nervous/anxious and has insomnia.    All other ROS negative except what is listed above and in the HPI.      Objective:    BP 124/74 (BP Location: Left Arm, Patient Position: Sitting, Cuff Size: Large)   Pulse 82   Temp 97.8 F (36.6 C)   Ht 5' 3 (1.6 m)   Wt 206 lb (93.4 kg)   LMP 11/29/2023 (Exact Date)   SpO2 98%   BMI 36.49 kg/m   Wt Readings from Last 3 Encounters:  12/02/23 206 lb (93.4 kg)  11/25/23 203 lb (92.1 kg)  10/21/23 196 lb (88.9 kg)    Physical Exam Vitals and nursing note reviewed.  Constitutional:      General: She is not in acute distress.    Appearance: Normal appearance.  HENT:     Head: Normocephalic and atraumatic.     Right Ear: Tympanic membrane, ear canal and external ear normal.     Left Ear: Tympanic membrane, ear canal and external ear normal.     Mouth/Throat:      Mouth: Mucous membranes are moist.     Pharynx: No posterior oropharyngeal erythema.  Eyes:     Conjunctiva/sclera: Conjunctivae normal.  Cardiovascular:     Rate and Rhythm: Normal rate and regular rhythm.     Pulses: Normal pulses.     Heart sounds: Normal heart sounds.  Pulmonary:     Effort: Pulmonary effort is normal.     Breath sounds: Normal breath sounds.  Abdominal:     Palpations: Abdomen is soft.     Tenderness: There is no abdominal tenderness.  Musculoskeletal:        General: Normal range of motion.     Cervical back: Normal range of motion and neck supple.     Right lower leg: No edema.     Left lower leg: No edema.  Lymphadenopathy:     Cervical: No cervical adenopathy.  Skin:    General: Skin is warm and dry.  Neurological:     General: No focal deficit present.     Mental Status: She is alert and oriented to person, place, and time.     Cranial Nerves: No cranial nerve deficit.     Coordination: Coordination normal.     Gait: Gait normal.  Psychiatric:        Mood and Affect: Mood normal.        Behavior: Behavior normal.        Thought Content: Thought content normal.        Judgment: Judgment normal.     Results for orders placed or  performed in visit on 07/22/23  Comprehensive metabolic panel with GFR   Collection Time: 07/22/23  9:39 AM  Result Value Ref Range   Glucose 96 70 - 99 mg/dL   BUN 9 6 - 24 mg/dL   Creatinine, Ser 9.28 0.57 - 1.00 mg/dL   eGFR 894 >40 fO/fpw/8.26   BUN/Creatinine Ratio 13 9 - 23   Sodium 140 134 - 144 mmol/L   Potassium 4.5 3.5 - 5.2 mmol/L   Chloride 103 96 - 106 mmol/L   CO2 19 (L) 20 - 29 mmol/L   Calcium 9.7 8.7 - 10.2 mg/dL   Total Protein 6.9 6.0 - 8.5 g/dL   Albumin 4.5 3.9 - 4.9 g/dL   Globulin, Total 2.4 1.5 - 4.5 g/dL   Bilirubin Total 0.2 0.0 - 1.2 mg/dL   Alkaline Phosphatase 102 44 - 121 IU/L   AST 16 0 - 40 IU/L   ALT 14 0 - 32 IU/L  Lipid Panel With LDL/HDL Ratio   Collection Time:  07/22/23  9:39 AM  Result Value Ref Range   Cholesterol, Total 201 (H) 100 - 199 mg/dL   Triglycerides 898 0 - 149 mg/dL   HDL 52 >60 mg/dL   VLDL Cholesterol Cal 18 5 - 40 mg/dL   LDL Chol Calc (NIH) 868 (H) 0 - 99 mg/dL   LDL/HDL Ratio 2.5 0.0 - 3.2 ratio  Vitamin B12   Collection Time: 07/22/23  9:39 AM  Result Value Ref Range   Vitamin B-12 534 232 - 1,245 pg/mL  VITAMIN D  25 Hydroxy (Vit-D Deficiency, Fractures)   Collection Time: 07/22/23  9:39 AM  Result Value Ref Range   Vit D, 25-Hydroxy 47.9 30.0 - 100.0 ng/mL   The 10-year ASCVD risk score (Arnett DK, et al., 2019) is: 1%   Values used to calculate the score:     Age: 81 years     Clincally relevant sex: Female     Is Non-Hispanic African American: No     Diabetic: No     Tobacco smoker: No     Systolic Chausse Pressure: 124 mmHg     Is BP treated: No     HDL Cholesterol: 52 mg/dL     Total Cholesterol: 201 mg/dL     Assessment & Plan:   Problem List Items Addressed This Visit       Cardiovascular and Mediastinum   Menstrual migraine without status migrainosus, not intractable   Chronic, stable.  She takes advil  migraine prn which helps.  Follow-up as symptoms worsen or with any concerns.      Relevant Medications   escitalopram  (LEXAPRO ) 10 MG tablet   gabapentin (NEURONTIN) 300 MG capsule     Nervous and Auditory   Chronic right-sided low back pain with right-sided sciatica   Chronic low back pain with right-sided radiculopathy persists despite improvement from chiropractic care. MRI revealed a bulging disc. Start gabapentin 300mg  at bedtime for pain relief and sleep aid, with the option to increase to twice daily if needed. Continue chiropractic care and exercises, including yoga stretches and strength training. Follow up with a neurosurgeon for further evaluation and management options, including potential spinal injections.      Relevant Medications   escitalopram  (LEXAPRO ) 10 MG tablet   gabapentin  (NEURONTIN) 300 MG capsule     Genitourinary   Abnormal uterine bleeding   Irregular and heavy menstrual bleeding is due to perimenopausal symptoms. Start the Xulane patch once a week for three weeks,  then off for one week, to regulate menstrual cycles. Monitor for side effects such as adhesive irritation or nausea.         Other   Depression, major, single episode, moderate (HCC)   Chronic, not controlled. Anxiety and depression have worsened recently and she would like to restart lexapro . Start lexapro  10mg  daily. Follow-up in 2-3 months.       Relevant Medications   escitalopram  (LEXAPRO ) 10 MG tablet   Vitamin D  deficiency   Continue vitamin D  supplement and is scheduled for repeat labs in December.       Prediabetes   Chronic, stable. Continue focus on nutrition and exercise. She is getting labs repeated in December.       Mixed hyperlipidemia   Chronic, stable. Continue focus on nutrition and exercise. Scheduled to have labs in December      Routine general medical examination at a health care facility - Primary   Health maintenance reviewed and updated. Discussed nutrition, exercise. Follow-up 1 year.         Follow up plan: Return in about 3 months (around 03/03/2024) for Anxiety, insomnia.   LABORATORY TESTING:  - Pap smear: up to date  IMMUNIZATIONS:   - Tdap: Tetanus vaccination status reviewed: last tetanus booster within 10 years. - Influenza: Declined - Pneumovax: Not applicable - Prevnar: Not applicable - HPV: Not applicable - Shingrix vaccine: Not applicable  SCREENING: -Mammogram: Up to date  - Colonoscopy: Up to date  - Bone Density: Not applicable   PATIENT COUNSELING:   Advised to take 1 mg of folate supplement per day if capable of pregnancy.   Sexuality: Discussed sexually transmitted diseases, partner selection, use of condoms, avoidance of unintended pregnancy  and contraceptive alternatives.   Advised to avoid cigarette smoking.  I  discussed with the patient that most people either abstain from alcohol or drink within safe limits (<=14/week and <=4 drinks/occasion for males, <=7/weeks and <= 3 drinks/occasion for females) and that the risk for alcohol disorders and other health effects rises proportionally with the number of drinks per week and how often a drinker exceeds daily limits.  Discussed cessation/primary prevention of drug use and availability of treatment for abuse.   Diet: Encouraged to adjust caloric intake to maintain  or achieve ideal body weight, to reduce intake of dietary saturated fat and total fat, to limit sodium intake by avoiding high sodium foods and not adding table salt, and to maintain adequate dietary potassium and calcium preferably from fresh fruits, vegetables, and low-fat dairy products.    stressed the importance of regular exercise  Injury prevention: Discussed safety belts, safety helmets, smoke detector, smoking near bedding or upholstery.   Dental health: Discussed importance of regular tooth brushing, flossing, and dental visits.    NEXT PREVENTATIVE PHYSICAL DUE IN 1 YEAR. Return in about 3 months (around 03/03/2024) for Anxiety, insomnia.  Audra Kagel A Olando Willems

## 2023-12-02 NOTE — Assessment & Plan Note (Signed)
 Chronic, stable.  She takes advil migraine prn which helps.  Follow-up as symptoms worsen or with any concerns.

## 2023-12-02 NOTE — Assessment & Plan Note (Signed)
 Chronic, not controlled. Anxiety and depression have worsened recently and she would like to restart lexapro . Start lexapro  10mg  daily. Follow-up in 2-3 months.

## 2023-12-02 NOTE — Assessment & Plan Note (Signed)
 Chronic, stable. Continue focus on nutrition and exercise. She is getting labs repeated in December.

## 2023-12-08 ENCOUNTER — Encounter: Payer: Self-pay | Admitting: Radiology
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
## 2023-12-18 ENCOUNTER — Ambulatory Visit: Admitting: Orthopedic Surgery

## 2023-12-18 ENCOUNTER — Encounter: Payer: Self-pay | Admitting: Orthopedic Surgery

## 2023-12-18 VITALS — BP 132/76 | Ht 63.0 in | Wt 208.1 lb

## 2023-12-18 DIAGNOSIS — M5126 Other intervertebral disc displacement, lumbar region: Secondary | ICD-10-CM

## 2023-12-18 DIAGNOSIS — M5416 Radiculopathy, lumbar region: Secondary | ICD-10-CM

## 2023-12-18 DIAGNOSIS — M51372 Other intervertebral disc degeneration, lumbosacral region with discogenic back pain and lower extremity pain: Secondary | ICD-10-CM | POA: Diagnosis not present

## 2023-12-18 DIAGNOSIS — M47816 Spondylosis without myelopathy or radiculopathy, lumbar region: Secondary | ICD-10-CM

## 2023-12-18 DIAGNOSIS — M5127 Other intervertebral disc displacement, lumbosacral region: Secondary | ICD-10-CM

## 2023-12-18 DIAGNOSIS — M51362 Other intervertebral disc degeneration, lumbar region with discogenic back pain and lower extremity pain: Secondary | ICD-10-CM

## 2023-12-18 NOTE — Patient Instructions (Signed)
 It was so nice to see you today. Thank you so much for coming in.    You have wear and tear in your back with slip at L4-L5 and right sided disc at L5-S1.   I sent physical therapy orders to [facility] in [facility]. You can call them at [phone] if you don't hear from them to schedule your visit. They are located at [address].   I want you to see physical medicine and rehab at the [facility] to discuss possible injections in your lower back. Dr. Avanell, Dr. Dodson, and their NP [place] are great and will take good care of you. They should call you to schedule an appointment or you can call them at [phone].   I will see you back in 6-8 weeks. Please do not hesitate to call if you have any questions or concerns. You can also message me in MyChart.   Glade Boys PA-C [phone]     The physicians and staff at [facility] at [facility] are committed to providing excellent care. You may receive a survey asking for feedback about your experience at our office. We value you your feedback and appreciate you taking the time to to fill it out. The [facility] leadership team is also available to discuss your experience in person, feel free to contact us  [phone].

## 2023-12-23 ENCOUNTER — Ambulatory Visit: Admitting: Nurse Practitioner

## 2023-12-23 ENCOUNTER — Ambulatory Visit: Admitting: Physician Assistant

## 2023-12-30 DIAGNOSIS — M5459 Other low back pain: Secondary | ICD-10-CM | POA: Diagnosis not present

## 2024-01-13 DIAGNOSIS — M5441 Lumbago with sciatica, right side: Secondary | ICD-10-CM | POA: Diagnosis not present

## 2024-01-13 DIAGNOSIS — M5416 Radiculopathy, lumbar region: Secondary | ICD-10-CM | POA: Diagnosis not present

## 2024-01-13 DIAGNOSIS — M5459 Other low back pain: Secondary | ICD-10-CM | POA: Diagnosis not present

## 2024-01-13 DIAGNOSIS — G8929 Other chronic pain: Secondary | ICD-10-CM | POA: Diagnosis not present

## 2024-01-15 DIAGNOSIS — M5459 Other low back pain: Secondary | ICD-10-CM | POA: Diagnosis not present

## 2024-01-20 DIAGNOSIS — M5459 Other low back pain: Secondary | ICD-10-CM | POA: Diagnosis not present

## 2024-01-22 DIAGNOSIS — M5459 Other low back pain: Secondary | ICD-10-CM | POA: Diagnosis not present

## 2024-01-23 DIAGNOSIS — M5416 Radiculopathy, lumbar region: Secondary | ICD-10-CM | POA: Diagnosis not present

## 2024-01-27 DIAGNOSIS — M5459 Other low back pain: Secondary | ICD-10-CM | POA: Diagnosis not present

## 2024-02-03 DIAGNOSIS — M5459 Other low back pain: Secondary | ICD-10-CM | POA: Diagnosis not present
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
## 2024-02-17 ENCOUNTER — Ambulatory Visit: Admitting: Orthopedic Surgery
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
## 2024-02-23 ENCOUNTER — Ambulatory Visit

## 2024-02-23 ENCOUNTER — Ambulatory Visit: Admitting: Orthopedic Surgery

## 2024-02-23 ENCOUNTER — Encounter: Payer: Self-pay | Admitting: Orthopedic Surgery

## 2024-02-23 VITALS — BP 110/84 | Ht 63.0 in | Wt 211.4 lb

## 2024-02-23 DIAGNOSIS — M4726 Other spondylosis with radiculopathy, lumbar region: Secondary | ICD-10-CM | POA: Diagnosis not present

## 2024-02-23 DIAGNOSIS — M47816 Spondylosis without myelopathy or radiculopathy, lumbar region: Secondary | ICD-10-CM

## 2024-02-23 DIAGNOSIS — G8929 Other chronic pain: Secondary | ICD-10-CM

## 2024-02-23 DIAGNOSIS — M5136 Other intervertebral disc degeneration, lumbar region with discogenic back pain only: Secondary | ICD-10-CM | POA: Diagnosis not present

## 2024-02-23 DIAGNOSIS — M5126 Other intervertebral disc displacement, lumbar region: Secondary | ICD-10-CM

## 2024-02-23 DIAGNOSIS — M545 Low back pain, unspecified: Secondary | ICD-10-CM

## 2024-02-23 DIAGNOSIS — M51362 Other intervertebral disc degeneration, lumbar region with discogenic back pain and lower extremity pain: Secondary | ICD-10-CM

## 2024-02-23 DIAGNOSIS — M5416 Radiculopathy, lumbar region: Secondary | ICD-10-CM

## 2024-02-23 DIAGNOSIS — M48061 Spinal stenosis, lumbar region without neurogenic claudication: Secondary | ICD-10-CM | POA: Diagnosis not present

## 2024-03-02 NOTE — Progress Notes (Incomplete)
 "   Established Patient Office Visit Subjective:    Patient ID: Kayla Zimmerman, female    DOB: 04-14-1975, 49 y.o.   MRN: [id number]  Chief Complaint No chief complaint on file.    HPI Kayla Zimmerman is a 49 y.o. female who presents for her 56-month scheduled follow up.  At our last visit, 12/02/2023, Kayla Zimmerman reported her stress levels having risen significantly due to personal issues, impacting her mental health as she began experiencing depressive symptoms and difficulty sleeping, only getting about 3 hours per night - unable to tolerate Melatonin as this worsens depression. We had decided to restart her on Lexapro  10mg  daily.      12/02/2023    9:37 AM 09/29/2023   11:29 AM 04/30/2023    1:07 PM 04/30/2023    1:06 PM 04/23/2023    2:37 PM  Depression screen PHQ 2/9  Decreased Interest 2 0 0 0 0  Down, Depressed, Hopeless 2 0  0 0  PHQ - 2 Score 4 0 0 0 0  Altered sleeping 3 2 0    Tired, decreased energy 3 1 0    Change in appetite 3 0 0    Feeling bad or failure about yourself  3 0 0    Trouble concentrating 0 0 0    Moving slowly or fidgety/restless 0 0 0    Suicidal thoughts 1 0 0    PHQ-9 Score 17  3  0     Difficult doing work/chores Somewhat difficult Somewhat difficult Not difficult at all       Data saved with a previous flowsheet row definition      12/02/2023    9:37 AM 09/29/2023   11:29 AM 11/29/2022    8:47 AM 11/21/2021    1:39 PM  GAD 7 : Generalized Anxiety Score  Nervous, Anxious, on Edge 0  0  0  0   Control/stop worrying 3  0  0  0   Worry too much - different things 2  0  0  1   Trouble relaxing 1  0  0  1   Restless 0  0  0  0   Easily annoyed or irritable 0  0  1  1   Afraid - awful might happen 0  0  0  0   Total GAD 7 Score 6 0 1 3  Anxiety Difficulty Somewhat difficult Somewhat difficult Not difficult at all Somewhat difficult     Data saved with a previous flowsheet row definition   Review of Systems     Objective:    LMP 02/20/2024    BP Readings from Last 3 Encounters:  02/23/24 110/84  12/18/23 132/76  12/02/23 124/74   Wt Readings from Last 3 Encounters:  02/23/24 211 lb 6 oz (95.9 kg)  12/18/23 208 lb 2 oz (94.4 kg)  12/02/23 206 lb (93.4 kg)   Physical Exam  No results found for any visits on 03/03/24.   The 10-year ASCVD risk score (Arnett DK, et al., 2019) is: 0.8%      Assessment & Plan:   Problem List Items Addressed This Visit   None   Tinnie DELENA Harada, NP  I,Emily Lagle,acting as a scribe for [employer], NP.,have documented all relevant documentation on the behalf of Lauren DELENA Harada, NP.  I, Tinnie DELENA Harada, NP, have reviewed all documentation for this visit. The documentation on 03/03/2024 for the exam, diagnosis, procedures, and orders  are all accurate and complete.  "

## 2024-03-02 NOTE — Assessment & Plan Note (Signed)
 Chronic, not controlled. Anxiety and depression have worsened recently and she would like to restart lexapro . Start lexapro  10mg  daily. Follow-up in 2-3 months.

## 2024-03-03 ENCOUNTER — Ambulatory Visit: Admitting: Nurse Practitioner

## 2024-03-03 DIAGNOSIS — F321 Major depressive disorder, single episode, moderate: Secondary | ICD-10-CM
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
## 2024-03-09 ENCOUNTER — Encounter: Payer: Self-pay | Admitting: Neurosurgery

## 2024-03-09 ENCOUNTER — Ambulatory Visit: Admitting: Neurosurgery

## 2024-03-09 VITALS — BP 138/64 | Ht 63.0 in | Wt 211.0 lb

## 2024-03-09 DIAGNOSIS — G8929 Other chronic pain: Secondary | ICD-10-CM | POA: Diagnosis not present

## 2024-03-09 DIAGNOSIS — M532X6 Spinal instabilities, lumbar region: Secondary | ICD-10-CM | POA: Diagnosis not present

## 2024-03-09 DIAGNOSIS — M4316 Spondylolisthesis, lumbar region: Secondary | ICD-10-CM

## 2024-03-11 ENCOUNTER — Ambulatory Visit: Admitting: Nurse Practitioner

## 2024-03-11 ENCOUNTER — Encounter: Payer: Self-pay | Admitting: Nurse Practitioner

## 2024-03-11 VITALS — BP 128/70 | HR 82 | Temp 97.9°F | Ht 63.0 in | Wt 220.4 lb

## 2024-03-11 DIAGNOSIS — F321 Major depressive disorder, single episode, moderate: Secondary | ICD-10-CM

## 2024-03-11 DIAGNOSIS — G8929 Other chronic pain: Secondary | ICD-10-CM

## 2024-03-11 MED ORDER — DULOXETINE HCL 30 MG PO CPEP: 30.0000 mg | ORAL_CAPSULE | Freq: Every day | ORAL | 1 refills | Status: AC

## 2024-03-11 NOTE — Progress Notes (Signed)
 "   Established Patient Office Visit Subjective:    Patient ID: Kayla Zimmerman, female    DOB: 04/20/1975, 49 y.o.   MRN: [id number]  Chief Complaint Chief Complaint  Patient presents with   Anxiety and Insomnia    Follow up, concerns with Gabapentin      HPI Kayla Zimmerman is a 49 y.o. female who presents for her 58-month scheduled follow up.  Discussed the use of AI scribe software for clinical note transcription with the patient, who gave verbal consent to proceed.  She has longstanding bilateral back pain that has persisted despite physical therapy and injections, which worsened her symptoms. Pain is localized to the back, no longer radiates down the leg after chiropractic care, and is worsened by bending, twisting, standing, sitting, lying down, and sneezing. It significantly disrupts sleep.  She was prescribed gabapentin  but can only take it at night due to marked morning sedation and has not finished the initial 30-tablet prescription. She is seeking an alternative medication.  She takes Lexapro , which adequately controls her depression. Her main concerns today are uncontrolled back pain and poor sleep. She has paused weight loss efforts because of pain and has regained some weight.        03/11/2024    2:44 PM 12/02/2023    9:37 AM 09/29/2023   11:29 AM 04/30/2023    1:07 PM 04/30/2023    1:06 PM  Depression screen PHQ 2/9  Decreased Interest 1 2 0 0 0  Down, Depressed, Hopeless 1 2 0  0  PHQ - 2 Score 2 4 0 0 0  Altered sleeping 3 3 2  0   Tired, decreased energy 3 3 1  0   Change in appetite 2 3 0 0   Feeling bad or failure about yourself  1 3 0 0   Trouble concentrating 0 0 0 0   Moving slowly or fidgety/restless 0 0 0 0   Suicidal thoughts 0 1 0 0   PHQ-9 Score 11 17  3   0    Difficult doing work/chores Very difficult Somewhat difficult Somewhat difficult Not difficult at all      Data saved with a previous flowsheet row definition      03/11/2024    2:45 PM 12/02/2023     9:37 AM 09/29/2023   11:29 AM 11/29/2022    8:47 AM  GAD 7 : Generalized Anxiety Score  Nervous, Anxious, on Edge 0 0  0  0   Control/stop worrying 1 3  0  0   Worry too much - different things 0 2  0  0   Trouble relaxing 3 1  0  0   Restless 3 0  0  0   Easily annoyed or irritable 1 0  0  1   Afraid - awful might happen 0 0  0  0   Total GAD 7 Score 8 6 0 1  Anxiety Difficulty Very difficult Somewhat difficult Somewhat difficult Not difficult at all     Data saved with a previous flowsheet row definition   Review of Systems See pertinent positives and negatives per HPI.     Objective:    BP 128/70 (BP Location: Left Arm, Patient Position: Sitting, Cuff Size: Large)   Pulse 82   Temp 97.9 F (36.6 C)   Ht 5' 3 (1.6 m)   Wt 220 lb 6.4 oz (100 kg)   LMP 02/20/2024 (Exact Date)   SpO2 98%   BMI  39.04 kg/m   BP Readings from Last 3 Encounters:  03/11/24 128/70  03/09/24 138/64  02/23/24 110/84   Wt Readings from Last 3 Encounters:  03/11/24 220 lb 6.4 oz (100 kg)  03/09/24 211 lb (95.7 kg)  02/23/24 211 lb 6 oz (95.9 kg)   Physical Exam Vitals and nursing note reviewed.  Constitutional:      General: She is not in acute distress.    Appearance: Normal appearance.  HENT:     Head: Normocephalic.  Eyes:     Conjunctiva/sclera: Conjunctivae normal.  Cardiovascular:     Rate and Rhythm: Normal rate and regular rhythm.     Pulses: Normal pulses.     Heart sounds: Normal heart sounds.  Pulmonary:     Effort: Pulmonary effort is normal.     Breath sounds: Normal breath sounds.  Musculoskeletal:        General: Tenderness (low back bilaterally) present.     Cervical back: Normal range of motion.     Comments: Lumbar ROM limited due to pain  Skin:    General: Skin is warm.  Neurological:     General: No focal deficit present.     Mental Status: She is alert and oriented to person, place, and time.  Psychiatric:        Mood and Affect: Mood normal.         Behavior: Behavior normal.        Thought Content: Thought content normal.        Judgment: Judgment normal.    The 10-year ASCVD risk score (Arnett DK, et al., 2019) is: 1%      Assessment & Plan:   Problem List Items Addressed This Visit       Nervous and Auditory   Chronic right-sided low back pain with right-sided sciatica   Chronic low back pain continues to affect her sleep. Gabapentin  causes drowsiness, so it is limited to nighttime use. She is scheduled for a surgical evaluation. Switching from Lexapro  to Cymbalta  is discussed to address both depression and nerve pain. Start Cymbalta  30mg  daily and stop lexapro . Continue physical therapy and stretching exercises. Use a heating pad for symptomatic relief. Follow up with the surgeon next week. Follow-up in 4-6 weeks.       Relevant Medications   DULoxetine  (CYMBALTA ) 30 MG capsule     Other   Depression, major, single episode, moderate (HCC) - Primary   Currently managed with Lexapro , but considering Cymbalta  to address depression, nerve pain, and potentially improve sleep. She is open to trying Cymbalta . Switch from Lexapro  to Cymbalta  30 mg daily.  Discussed possible side effects. Follow-up in 4-6 weeks.       Relevant Medications   DULoxetine  (CYMBALTA ) 30 MG capsule    Kayla DELENA Harada, NP  I,Kayla Zimmerman,acting as a scribe for [employer], NP.,have documented all relevant documentation on the behalf of Kayla Zimmerman DELENA Harada, NP.  I, Kayla DELENA Harada, NP, have reviewed all documentation for this visit. The documentation on 03/11/2024 for the exam, diagnosis, procedures, and orders are all accurate and complete. "

## 2024-03-11 NOTE — Assessment & Plan Note (Signed)
 Chronic low back pain continues to affect her sleep. Gabapentin  causes drowsiness, so it is limited to nighttime use. She is scheduled for a surgical evaluation. Switching from Lexapro  to Cymbalta  is discussed to address both depression and nerve pain. Start Cymbalta  30mg  daily and stop lexapro . Continue physical therapy and stretching exercises. Use a heating pad for symptomatic relief. Follow up with the surgeon next week. Follow-up in 4-6 weeks.

## 2024-03-11 NOTE — Assessment & Plan Note (Addendum)
 Currently managed with Lexapro , but considering Cymbalta  to address depression, nerve pain, and potentially improve sleep. She is open to trying Cymbalta . Switch from Lexapro  to Cymbalta  30 mg daily.  Discussed possible side effects. Follow-up in 4-6 weeks.

## 2024-03-11 NOTE — Patient Instructions (Signed)
 It was great to see you!  Let's switch lexapro  to cymbalta  once a day   You can take this in the morning or evening with or without food   Continue using heat and stretching   Let's follow-up in 4-6 weeks, sooner if you have concerns.  If a referral was placed today, you will be contacted for an appointment. Please note that routine referrals can sometimes take up to 3-4 weeks to process. Please call our office if you haven't heard anything after this time frame.  Take care,  Tinnie Harada, NP

## 2024-04-20 ENCOUNTER — Ambulatory Visit: Admitting: Nurse Practitioner
